# Patient Record
Sex: Male | Born: 1945 | Race: White | Marital: Married | Smoking: Former smoker
Health system: Southern US, Community
[De-identification: ages and names within clinical notes are randomized; demographics above are authoritative.]

## PROBLEM LIST (undated history)

## (undated) DIAGNOSIS — J310 Chronic rhinitis: Secondary | ICD-10-CM

## (undated) DIAGNOSIS — Z9989 Dependence on other enabling machines and devices: Secondary | ICD-10-CM

## (undated) DIAGNOSIS — M1990 Unspecified osteoarthritis, unspecified site: Secondary | ICD-10-CM

## (undated) DIAGNOSIS — E7800 Pure hypercholesterolemia, unspecified: Secondary | ICD-10-CM

## (undated) DIAGNOSIS — I10 Essential (primary) hypertension: Secondary | ICD-10-CM

## (undated) DIAGNOSIS — N411 Chronic prostatitis: Secondary | ICD-10-CM

## (undated) DIAGNOSIS — K649 Unspecified hemorrhoids: Secondary | ICD-10-CM

## (undated) DIAGNOSIS — J449 Chronic obstructive pulmonary disease, unspecified: Secondary | ICD-10-CM

## (undated) DIAGNOSIS — C9110 Chronic lymphocytic leukemia of B-cell type not having achieved remission: Secondary | ICD-10-CM

## (undated) DIAGNOSIS — G4733 Obstructive sleep apnea (adult) (pediatric): Secondary | ICD-10-CM

## (undated) HISTORY — DX: Pure hypercholesterolemia, unspecified: E78.00

## (undated) HISTORY — DX: Unspecified osteoarthritis, unspecified site: M19.90

## (undated) HISTORY — DX: Chronic obstructive pulmonary disease, unspecified: J44.9

## (undated) HISTORY — PX: SPINAL CORD DECOMPRESSION: SHX97

## (undated) HISTORY — DX: Chronic lymphocytic leukemia of B-cell type not having achieved remission: C91.10

## (undated) HISTORY — DX: Chronic rhinitis: J31.0

## (undated) HISTORY — DX: Chronic prostatitis: N41.1

## (undated) HISTORY — DX: Essential (primary) hypertension: I10

## (undated) HISTORY — DX: Obstructive sleep apnea (adult) (pediatric): G47.33

## (undated) HISTORY — PX: OTHER SURGICAL HISTORY: SHX169

## (undated) HISTORY — DX: Dependence on other enabling machines and devices: Z99.89

## (undated) HISTORY — DX: Unspecified hemorrhoids: K64.9

---

## 2010-11-12 ENCOUNTER — Encounter (HOSPITAL_COMMUNITY)
Admission: RE | Admit: 2010-11-12 | Discharge: 2010-11-12 | Disposition: A | Discharge status: Home / Self Care | Payer: Medicare Other | Source: Ambulatory Visit | Attending: Neurosurgery | Admitting: Neurosurgery

## 2010-11-12 LAB — BASIC METABOLIC PANEL
BUN: 12 mg/dL (ref 6–23)
CO2: 28 mEq/L (ref 19–32)
Calcium: 8.9 mg/dL (ref 8.4–10.5)
Chloride: 102 mEq/L (ref 96–112)
Creatinine, Ser: 0.88 mg/dL (ref 0.4–1.5)
GFR calc Af Amer: >60 mL/min (ref >60)
GFR calc non Af Amer: >60 mL/min (ref >60)
Glucose, Bld: 101 mg/dL — ABNORMAL HIGH (ref 70–99)
Potassium: 4.7 mEq/L (ref 3.5–5.1)
Sodium: 136 mEq/L (ref 135–145)

## 2010-11-12 LAB — CBC
HCT: 42.7 % (ref 39.0–52.0)
Hemoglobin: 15.3 g/dL (ref 13.0–17.0)
MCH: 33 pg (ref 26.0–34.0)
MCHC: 35.8 g/dL (ref 30.0–36.0)
MCV: 92 fL (ref 78.0–100.0)
Platelets: 165 10*3/uL (ref 150–400)
RBC: 4.64 MIL/uL (ref 4.22–5.81)
RDW: 12.2 % (ref 11.5–15.5)
WBC: 4.8 10*3/uL (ref 4.0–10.5)

## 2010-11-12 LAB — SURGICAL PCR SCREEN
MRSA, PCR: NEGATIVE
Staphylococcus aureus: NEGATIVE

## 2010-11-19 ENCOUNTER — Inpatient Hospital Stay (HOSPITAL_COMMUNITY)
Admission: RE | Admit: 2010-11-19 | Discharge: 2010-11-20 | DRG: 473 | Disposition: A | Discharge status: Home / Self Care | Payer: Medicare Other | Source: Ambulatory Visit | Attending: Neurosurgery | Admitting: Neurosurgery

## 2010-11-19 ENCOUNTER — Inpatient Hospital Stay (HOSPITAL_COMMUNITY): Payer: Medicare Other

## 2010-11-19 ENCOUNTER — Other Ambulatory Visit (HOSPITAL_COMMUNITY): Payer: Self-pay | Admitting: Neurosurgery

## 2010-11-19 ENCOUNTER — Ambulatory Visit (HOSPITAL_COMMUNITY)
Admission: RE | Admit: 2010-11-19 | Discharge: 2010-11-19 | Disposition: A | Discharge status: Home / Self Care | Payer: Medicare Other | Source: Ambulatory Visit | Attending: Neurosurgery | Admitting: Neurosurgery

## 2010-11-19 DIAGNOSIS — Z01818 Encounter for other preprocedural examination: Secondary | ICD-10-CM

## 2010-11-19 DIAGNOSIS — N400 Enlarged prostate without lower urinary tract symptoms: Secondary | ICD-10-CM | POA: Diagnosis present

## 2010-11-19 DIAGNOSIS — Z01812 Encounter for preprocedural laboratory examination: Secondary | ICD-10-CM

## 2010-11-19 DIAGNOSIS — Z23 Encounter for immunization: Secondary | ICD-10-CM

## 2010-11-19 DIAGNOSIS — M47812 Spondylosis without myelopathy or radiculopathy, cervical region: Principal | ICD-10-CM | POA: Diagnosis present

## 2010-11-19 DIAGNOSIS — G4733 Obstructive sleep apnea (adult) (pediatric): Secondary | ICD-10-CM | POA: Diagnosis present

## 2010-11-19 DIAGNOSIS — M5020 Other cervical disc displacement, unspecified cervical region: Secondary | ICD-10-CM | POA: Diagnosis present

## 2010-11-19 DIAGNOSIS — J45909 Unspecified asthma, uncomplicated: Secondary | ICD-10-CM | POA: Diagnosis present

## 2010-11-19 DIAGNOSIS — Z87891 Personal history of nicotine dependence: Secondary | ICD-10-CM

## 2010-11-19 DIAGNOSIS — E669 Obesity, unspecified: Secondary | ICD-10-CM | POA: Diagnosis present

## 2010-11-22 NOTE — Op Note (Signed)
NAME:  LAQUINTON, BIHM               ACCOUNT NO.:  [id number]  MEDICAL RECORD NO.:  [id number]           PATIENT TYPE:  O  LOCATION:  XRAY                         FACILITY:  [facility]  PHYSICIAN:  Hewitt Shorts, M.D.DATE OF BIRTH:  Dec 03, 1945  DATE OF PROCEDURE:  11/19/2010 DATE OF DISCHARGE:  11/19/2010                              OPERATIVE REPORT   PREOPERATIVE DIAGNOSES:  Cervical disk herniation, cervical spondylosis, cervical degenerative disc disease, and cervical radiculopathy.  POSTOPERATIVE DIAGNOSES:  Cervical disk herniation, cervical spondylosis, cervical degenerative disc disease, and cervical radiculopathy.  PROCEDURE:  C4-5, C5-6, and C6-7 anterior cervical decompression arthrodesis with allograft and tether cervical plating.  SURGEON:  Hewitt Shorts, MD  ASSISTANT:  Danae Orleans. Venetia Maxon, MD  ANESTHESIA:  General endotracheal.  INDICATIONS:  The patient is a 65 year old man who presented with neck pain and right cervical radicular pain.  He has had a long history of neck pain for at least 10 years.  He has undergone extensive nonsurgical management.  His symptoms were steadily worsening and less responsive to nonsurgical treatment and MRI scan showed multilevel spondylotic disk herniation with more encroachment to the right and to the left at several levels and a decision was made to proceed with decompression and arthrodesis.  PROCEDURE:  The patient was brought to the operating room and placed under general endotracheal anesthesia.  The patient was placed in 10 pounds of Holter traction.  Neck was prepped with Betadine soap solution and draped in sterile fashion.  An oblique incision was made on the left side of neck paralleling the anterior border of the left sternocleidomastoid.  The line of incision was infiltrated with local epinephrine.  Dissection was carried down through subcutaneous tissue and platysma.  Bipolar electrocautery was used to maintain  hemostasis. Dissection was then carried out through an avascular plane leaving the sternocleidomastoid, carotid artery, and jugular vein laterally and trachea and esophagus medially.  The ventral aspect of the vertebral column was identified and localized x-ray taken in the C4-5, C5-6, and C6-7 intervertebral disk space was identified.  Diskectomy was begun at each level with incision of the annulus with micro curettes, pituitary rongeurs.  Anterior osteophytic overgrowth was removed using double- action rongeurs and the osteophyte removal tool and edge of the bone were gently packed with Gelfoam with thrombin to establish hemostasis. The operative microscope was draped and brought into the field for additional navigation, illumination, and visualization and the remainder of decompression was performed using microdissection, microsurgical technique.  Diskectomy was continued posteriorly through each of the disk spaces using micro curettes and pituitary rongeurs.  The cartilaginous endplates of the corresponding vertebral were removed using micro curettes along with the high-speed drill and posterior osteophytic overgrowth which was substantial was removed at each level using the high-speed drill and a 2-mm Kerrison punch with thin footplate.  We then carefully opened and removed the posterior longitudinal ligament at each level removing the spondylotic disk herniation along with it.  We did find on the right side at C5-6 a soft disk herniation as well and this may have contributed to the right cervical radicular symptoms.  Once the spinal canal was decompressed and the thecal sac decompressed at each level, we then turned our attention to the neural foramen and decompressed the exiting nerve roots within each neural foramen removing spondylitic overgrowth, thickened ligament, and spurring to do so and once the neural foramen and exiting nerve roots were decompressed, we established  hemostasis with use of Gelfoam soaked in thrombin.  We measured the height of the intervertebral disk space and selected three 6-mm allografts.  Each was hydrated in saline solution and then positioned in the intervertebral disk space and countersunk.  We then selected a 49-mm tether cervical plate.  It was positioned over the fusion construct and secured to the vertebra with 4 x 15 mm screws using a pair of thick screws at C4, single fixed screws at C5 and C6, and a pair of variable screws at C7.  Once all 6 screws were placed, final tightening was performed.  Each of the screws was started with a pilot hole and placed and then final tightening was performed.  The wound was irrigated with bacitracin solution, hemostasis was established and confirmed and then we proceeded with closure.  An x- ray was taken which showed the grafts, plate, and screws all in good position.  The alignment was good.  Platysma was closed with interrupted and inverted 2-0 Vicryl sutures.  Subcutaneous and subcuticular were closed with inverted 3-0 Vicryl sutures.  Skin was approximated with Dermabond.  Procedure was tolerated well.  The estimated blood loss was 75 mL.  Sponge count correct.  Following surgery, the patient was taken out of cervical traction and placed in a soft cervical collar, reversed from the anesthetic, extubated, and transferred to recovery room for further care and was noted to be moving all 4 extremities to command.     Hewitt Shorts, M.D.     RWN/MEDQ  D:  11/19/2010  T:  11/20/2010  Job:  [id number]  Electronically Signed by Shirlean Kelly M.D. on 11/22/2010 08:06:55 AM

## 2011-05-05 IMAGING — CR DG CERVICAL SPINE 2 OR 3 VIEWS
1 series · 1 of 1 positions shown · non-contrast
Comparison: None.

CLINICAL DATA: C4-C7 ACDF.

CERVICAL SPINE - 2-3 VIEW

[view not recorded]
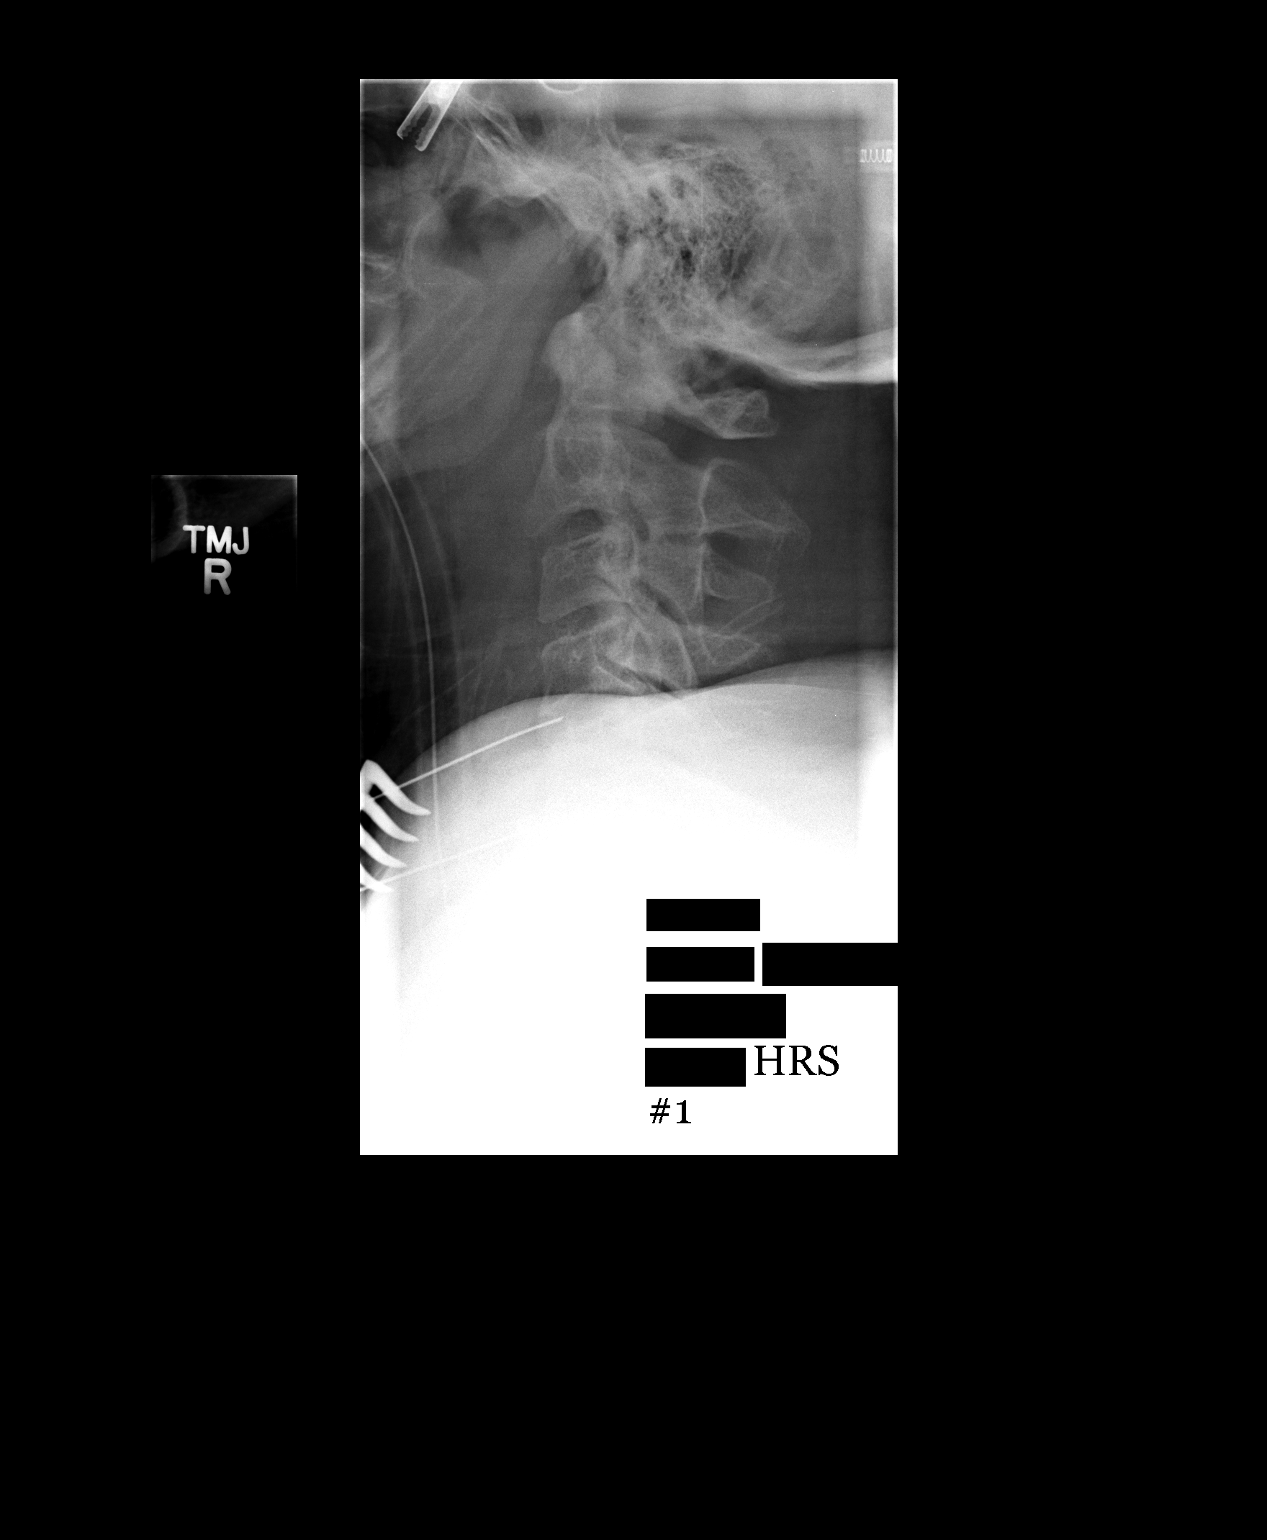

[1 of 1 positions shown; findings below may reference images not displayed]

FINDINGS: Two cross-table lateral views of the cervical spine are
submitted from the operating room.  Image #1 at 6274 hours
demonstrates anterior localizing needles at the C4-C5 and C5-C6
disc space levels.

Image #2 at 2244 hours demonstrates interval discectomy and fusion
from C4-C7.  Inferior aspect of the hardware is not well
visualized.  Hardware appears well positioned.  There are surgical
sponges within the field anterior to the plate.
IMPRESSION: Intraoperative views during C4-C7 ACDF.

## 2013-08-07 ENCOUNTER — Encounter: Payer: Self-pay | Admitting: Neurology

## 2013-08-14 ENCOUNTER — Ambulatory Visit (INDEPENDENT_AMBULATORY_CARE_PROVIDER_SITE_OTHER): Payer: Medicare Other | Admitting: Neurology

## 2013-08-14 ENCOUNTER — Encounter (INDEPENDENT_AMBULATORY_CARE_PROVIDER_SITE_OTHER): Payer: Self-pay

## 2013-08-14 ENCOUNTER — Encounter: Payer: Self-pay | Admitting: Neurology

## 2013-08-14 VITALS — BP 133/74 | HR 67 | Resp 16 | Ht 72.0 in | Wt 220.0 lb

## 2013-08-14 DIAGNOSIS — G4733 Obstructive sleep apnea (adult) (pediatric): Secondary | ICD-10-CM

## 2013-08-14 DIAGNOSIS — Z9989 Dependence on other enabling machines and devices: Secondary | ICD-10-CM

## 2013-08-14 DIAGNOSIS — J310 Chronic rhinitis: Secondary | ICD-10-CM

## 2013-08-14 HISTORY — DX: Chronic rhinitis: J31.0

## 2013-08-14 NOTE — Addendum Note (Signed)
Addended by: Melvyn NovasHMEIER, Bobbi Yount on: 08/14/2013 10:23 AM   Modules accepted: Orders

## 2013-08-14 NOTE — Progress Notes (Signed)
[facility]  Provider:  Melvyn Novasarmen  Chad Newton, M D  Referring Provider: Baird LyonsScott, Chad S, MD Primary Care Physician:  Chad LyonsSCOTT,Chad S, MD  Chief Complaint  Patient presents with  . Follow-up    yearly, Rm 11    HPI:  Chad Newton is a [age] y.o. male  Is seen here as a revisit  from Dr. Lorin PicketScott for CPAP follow up.     Last seen on 08/25/2012. The patient had undergone a sleep study after he reported an Epworth sleepiness score of 20 points. 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 Patient used CPAP since the year 2010 and was using is 90. He underwent a repeat sleep study on 06-22-12 was titrated to 8 cm water after an AHI of 20 was discovered. Is a 77 minutes at this pressure. His previous machine was set at 16 cm water and he had developed severe leaks at this but as the settings. He breathes better in a seated position and falls asleep easier for example in a recliner on a sulfa this may be related to his primary lung problem. He had no longer any EL and reported since he used CPAP the machine was reset to 8 cm water pressure with out EPR and last year his compliance she'll 4 hours and 21 minute nightly he was in the residual AHI of 7.4. He still has sometimes oral venting and today he reports that he recently overcame a head cold or flu which left him with some nasal congestion. His current Epworth sleepiness score is 8 points  His current GDS score is 1 point, his fatigue severity score is 34 points, this Epworth score is 8 points.  In office down load shows that the patient still has  fairly high air leaks,  His CPAP  pressure is still set at 8 cm water he uses his machine nightly for 4 hours and 23 minutes and his residual AHI is 6.4.  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 He used to get up at 2 AM in the morning. His bedtime  is 9 PM .    Review of Systems: Out of a complete 14 system review, the patient complains of only the following symptoms, and all other reviewed systems are negative. Rhinitis, congestion, COPD related bronchitis. His current GDS score is 1 point, his fatigue severity score is 34 points, this Epworth score is 8 points. Comfort  Blue mask.    History   Social History  . Marital Status: Married    Spouse Name: maxcine    Number of Children: 2  . Years of Education: 10   Occupational History  . retired    Social History Main Topics  . Smoking status: Former Games developermoker  . Smokeless tobacco: Not on file  . Alcohol Use: Yes  . Drug Use: No  . Sexual Activity: Not on file   Other Topics Concern  . Not on file   Social History Narrative   Patient is a caucasian male, right handed, divorced, disabled. He quit smoking 18 years ago, consumes no alcohol, formerly a heavy user, quit 18 years ago.    Family History  Problem Relation Age of Onset  . Diabetes Mother 4198  . Alzheimer's disease Father 6276  . Sleep apnea Brother     Past Medical  History  Diagnosis Date  . COPD (chronic obstructive pulmonary disease)   . Hemorrhoids   . Chronic prostatitis   . Arthritis   . Hypertension   . High cholesterol   . OSA on CPAP     tested and titrated  06-21-13   . Rhinitis, non-allergic 08/14/2013    Past Surgical History  Procedure Laterality Date  . Spinal cord decompression    . Anterior cervical compression    . Spinal stenosis surgery      Current Outpatient Prescriptions  Medication Sig Dispense Refill  . budesonide-formoterol (SYMBICORT) 160-4.5 MCG/ACT inhaler Inhale 2 puffs into the lungs 2 (two) times daily.      . Cyanocobalamin (B-12 PO) Take by mouth. 1 1/2 two times monthly.      . finasteride (PROSCAR) 5 MG tablet Take 5 mg by mouth daily.      Marland Kitchen latanoprost (XALATAN) 0.005 % ophthalmic solution 1 drop at bedtime.      Marland Kitchen lisinopril (PRINIVIL,ZESTRIL) 10 MG tablet  Take 10 mg by mouth daily.      . Multiple Vitamins-Minerals (CENTRUM SILVER PO) Take by mouth daily.      . simvastatin (ZOCOR) 20 MG tablet Take 20 mg by mouth daily.      . tamsulosin (FLOMAX) 0.4 MG CAPS capsule Take 0.4 mg by mouth daily.       No current facility-administered medications for this visit.    Allergies as of 08/14/2013  . (No Known Allergies)    Vitals: BP 133/74  Pulse 67  Resp 16  Ht 6' (1.829 m)  Wt 220 lb (99.791 kg)  BMI 29.83 kg/m2 Last Weight:  Wt Readings from Last 1 Encounters:  08/14/13 220 lb (99.791 kg)   Last Height:   Ht Readings from Last 1 Encounters:  08/14/13 6' (1.829 m)    Physical exam:  General: The patient is awake, alert and appears not in acute distress. The patient is well groomed. Head: Normocephalic, atraumatic. Neck is supple. Mallampati 3  neck circumference:17 inches . Cardiovascular:  Regular rate and rhythm, without  murmurs or carotid bruit, and without distended neck veins. Respiratory: Lungs wheezing, cough during auscultation. Skin:  Without evidence of edema, or rash Trunk: BMI is  elevated and patient  has normal posture.  Neurologic exam : The patient is awake and alert, oriented to place and time.  Memory subjective described as intact.  There is a normal attention span & concentration ability. Speech is fluent without dysarthria, dysphonia or aphasia. Mood and affect are appropriate.  Cranial nerves: Pupils are equal and briskly reactive to light. Funduscopic exam without  evidence of pallor or edema.  Extraocular movements  in vertical and horizontal planes intact and without nystagmus. Visual fields by finger perimetry are intact. Hearing to finger rub intact.  Facial sensation intact to fine touch. Facial motor strength is symmetric and tongue and uvula move midline.  Motor exam:  Normal tone and normal muscle bulk and symmetric normal strength in all extremities.  Sensory:  Fine touch, pinprick and  vibration were tested in all extremities. Proprioception is  normal.  Coordination: Finger-to-nose maneuver tested and normal without evidence of ataxia, dysmetria or tremor.  Gait and station: Patient walks without assistive device.  Deep tendon reflexes: in the  upper and lower extremities are symmetric and intact. Babinski maneuver response is  downgoing.   Assessment:  After physical and neurologic examination, review of laboratory studies, imaging, neurophysiology testing and pre-existing records, assessment is  1) OSA on CPAP with COPD.  [place] -[employer] , woud like to change to DME , [facility].  He was using a chin strap , for air leaks.  He no longer wakes up choking, suffocating.   Plan:  Treatment plan and additional workup :  Change to DME, [facility].  [place], needs new head gear , and chin strap. Compliance 72%.

## 2013-08-14 NOTE — Patient Instructions (Signed)

## 2013-08-16 ENCOUNTER — Encounter: Payer: Self-pay | Admitting: Neurology

## 2014-09-10 ENCOUNTER — Ambulatory Visit: Payer: Medicare Other | Admitting: Neurology

## 2014-10-03 ENCOUNTER — Ambulatory Visit (INDEPENDENT_AMBULATORY_CARE_PROVIDER_SITE_OTHER): Payer: Medicare Other | Admitting: Neurology

## 2014-10-03 ENCOUNTER — Encounter: Payer: Self-pay | Admitting: Neurology

## 2014-10-03 VITALS — BP 118/66 | HR 63 | Resp 18 | Ht 72.0 in | Wt 221.6 lb

## 2014-10-03 DIAGNOSIS — G458 Other transient cerebral ischemic attacks and related syndromes: Secondary | ICD-10-CM

## 2014-10-03 DIAGNOSIS — R1314 Dysphagia, pharyngoesophageal phase: Secondary | ICD-10-CM | POA: Diagnosis not present

## 2014-10-03 DIAGNOSIS — G4733 Obstructive sleep apnea (adult) (pediatric): Secondary | ICD-10-CM | POA: Diagnosis not present

## 2014-10-03 DIAGNOSIS — Z9989 Dependence on other enabling machines and devices: Secondary | ICD-10-CM

## 2014-10-03 DIAGNOSIS — R490 Dysphonia: Secondary | ICD-10-CM | POA: Diagnosis not present

## 2014-10-03 MED ORDER — ALPRAZOLAM 1 MG PO TABS: 1.0000 mg | ORAL_TABLET | Freq: Every evening | ORAL | Status: DC | PRN

## 2014-10-03 NOTE — Addendum Note (Signed)
Addended by: Melvyn NovasHMEIER, Cordarro Spinnato on: 10/03/2014 09:59 AM   Modules accepted: Orders

## 2014-10-03 NOTE — Progress Notes (Signed)
[facility]  Provider:  Melvyn Novasarmen  Alex Mcmanigal, M D  Referring Provider: Baird LyonsScott, Peter S, MD Primary Care Physician:  Baird LyonsSCOTT,PETER S, MD  Chief Complaint  Patient presents with  . RV cpap    Rm 10, alone    HPI:  Chad Newton is a 69 y.o. male  Is seen here as a revisit  from Dr. Lorin PicketScott for CPAP follow up.     Last seen on 08/25/2012. The patient had undergone a sleep study after he reported an Epworth sleepiness score of 20 points. 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 Patient used CPAP since the year 2010 and was using it nightly. He underwent a repeat sleep study on 06-22-12 was titrated to 8 cm water after an AHI of 20 was discovered. Is a 77 minutes at this pressure. His previous machine was set at 16 cm water and he had developed severe leaks at this but as the settings. He breathes better in a seated position and falls asleep easier for example in a recliner on a sulfa this may be related to his primary lung problem. He had no longer any EL and reported since he used CPAP the machine was reset to 8 cm water pressure with out EPR and last year his compliance she'll 4 hours and 21 minute nightly he was in the residual AHI of 7.4. He still has sometimes oral venting and today he reports that he recently overcame a head cold or flu which left him with some nasal congestion. His current Epworth sleepiness score is 8 points  His current GDS score is 1 point, his fatigue severity score is 34 points, this Epworth score is 8 points.  In office down load shows that the patient still has  fairly high air leaks,  His CPAP  pressure is still set at 8 cm water he uses his machine nightly for 4 hours and 23 minutes and his residual AHI is 6.4.  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 He used to get up at 2 AM in the morning. His  bedtime is 9 PM . Change to DME, [facility].  [place], needs new head gear , and chin strap. Compliance 72%.  Interval history 10-03-14.  The patient was not satisfied with advanced home care as they wouldn't supply him with necessary equipment for his CPAP.  [facility] [place] on [address] [place] [id number] customer representative this Ulla GalloBarbara McClung phone number is [phone]. [email or web address].  The patient endorsed today the fatigue severity scale at 28 points and the Epworth Sleepiness Scale at 7 points both are satisfactory. His medication lists no medication that would cause him to be sleepy or fatigued. He is on Zocor, Flomax, Prinivil, eyedrops Symbicort inhaler and Proscar.  His CPAP download dated 10-03-14, he has a 95% compliance. His usual user time is over 4 hours. Average time of use is 4 hours and 41 minutes. He had still an AHI of 6.1. He is CMS compliant. The pressure is set at 8 cm water with 0 EPR. He says he goes to sleep promptly with the machine and does not change the ramp time or final pressure.  The patient has a hoarse voice slight dysphonia and he reports a new symptom today he does have  swallowing difficulties for dry and flaky substances but also ice cold water or ice cream. He feels like something is lodged in his throat blocking. He also responds with a coughing spell. He has a history of hiatal hernia. He does not have any  myasthenic complaints. The dysarthria happens in the morning as well as in the  Evening. In the morning he has a lot of phlegm in his throat and has to clear it. I suspect that his dysarthria and dysphonia are related to acid reflux. He has not lost grip strength, he has not had any ptosis and he has not had diplopia. He is not usually short of breath. I would like him to be evaluated by a gastrointestinal specialist. He lives in [place] and his primary care physician is located in  [place].     Review of Systems:  Dysphagia , dysphonia, some dysarthria according to wife. Snoring without CPAP.  Out of a complete 14 system review, the patient complains of only the following symptoms, and all other reviewed systems are negative. Rhinitis, congestion, COPD related bronchitis.  His current GDS score is 3 points, his fatigue severity score is 28 from 34  points, this Epworth score is  7 from 8 points. Comfort Blue mask. AHI 6.1.    History   Social History  . Marital Status: Married    Spouse Name: maxcine  . Number of Children: 2  . Years of Education: 10   Occupational History  . retired    Social History Main Topics  . Smoking status: Former Games developer  . Smokeless tobacco: Not on file  . Alcohol Use: 0.0 oz/week    0 Standard drinks or equivalent per week     Comment: occ  . Drug Use: No  . Sexual Activity: Not on file   Other Topics Concern  . Not on file   Social History Narrative   Patient is a caucasian male, right handed, divorced, disabled. He quit smoking 18 years ago, consumes occ alcohol, formerly a heavy user, quit 18 years ago.    Family History  Problem Relation Age of Onset  . Diabetes Mother 56  . Alzheimer's disease Father 51  . Sleep apnea Brother     Past Medical History  Diagnosis Date  . COPD (chronic obstructive pulmonary disease)   . Hemorrhoids   . Chronic prostatitis   . Arthritis   . Hypertension   . High cholesterol   . OSA on CPAP     tested and titrated  06-21-13   . Rhinitis, non-allergic 08/14/2013    Past Surgical History  Procedure Laterality Date  . Spinal cord decompression    . Anterior cervical compression    . Spinal stenosis surgery      Current Outpatient Prescriptions  Medication Sig Dispense Refill  . budesonide-formoterol (SYMBICORT) 160-4.5 MCG/ACT inhaler Inhale 2 puffs into the lungs 2 (two) times daily.    . Cyanocobalamin (B-12 PO) Take by mouth. 1 1/2 two times monthly.    .  finasteride (PROSCAR) 5 MG tablet Take 5 mg by mouth daily.    Marland Kitchen latanoprost (XALATAN) 0.005 % ophthalmic solution Place 1 drop into both eyes at bedtime.     Marland Kitchen lisinopril (PRINIVIL,ZESTRIL) 10 MG tablet Take 10 mg by mouth daily.    . Multiple Vitamins-Minerals (CENTRUM SILVER PO) Take by mouth daily.    . simvastatin (ZOCOR) 40 MG tablet daily.  4  . tamsulosin (FLOMAX) 0.4 MG CAPS capsule Take 0.4 mg  by mouth daily.     No current facility-administered medications for this visit.    Allergies as of 10/03/2014  . (No Known Allergies)    Vitals: BP 118/66 mmHg  Pulse 63  Resp 18  Ht 6' (1.829 m)  Wt 221 lb 9.6 oz (100.517 kg)  BMI 30.05 kg/m2 Last Weight:  Wt Readings from Last 1 Encounters:  10/03/14 221 lb 9.6 oz (100.517 kg)   Last Height:   Ht Readings from Last 1 Encounters:  10/03/14 6' (1.829 m)    Physical exam:  General: The patient is awake, alert and appears not in acute distress. The patient is well groomed. Head: Normocephalic, atraumatic. Neck is supple. Mallampati 3  neck circumference:17 inches . Cardiovascular:  Regular rate and rhythm, without  murmurs or carotid bruit, and without distended neck veins. Respiratory: Lungs wheezing, cough during auscultation. Skin:  Without evidence of edema, or rash Trunk: BMI is  elevated and patient  has normal posture.  Neurologic exam : The patient is awake and alert, oriented to place and time.  Memory subjective described as intact.  There is a normal attention span & concentration ability. Speech is fluent without dysarthria, dysphonia or aphasia. Mood and affect are appropriate.  Cranial nerves: Pupils are equal and briskly reactive to light.  Visual fields by finger perimetry are intact. Hearing to finger rub intact.  Facial sensation intact to fine touch. Facial motor strength is symmetric and tongue and uvula move midline.  Motor exam:  Normal tone and normal muscle bulk and symmetric normal strength in all  extremities.  Sensory:  Deferred.  Coordination: Finger-to-nose maneuver tested and normal without evidence of ataxia, dysmetria or tremor.  Deep tendon reflexes: in the upper and lower extremities are symmetric and intact. Babinski maneuver response is downgoing.   Assessment:  After physical and neurologic examination, review of laboratory studies, imaging, neurophysiology testing and pre-existing records, assessment is 25 minutes   1) OSA on CPAP with COPD.  Overlap syndrome. Was on CPAP at 18 now on 8 cm water . Compliance.  2) Dysphagia and dysphonia in patient with GERD and Hiatal hernia. Referral to GI.  No evidence of myasthenia.   Patsy Lager ,DME in [place].  He was using a chin strap , for air leaks.  He no longer wakes up choking, suffocating.   Plan:  Treatment plan and additional workup : Pepcid OTC. ST for speech and swallowing evaluation. 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 He has aphasia spells  since 2011.

## 2014-10-04 LAB — COMPREHENSIVE METABOLIC PANEL
A/G RATIO: 3 — AB (ref 1.1–2.5)
ALT: 24 IU/L (ref 0–44)
AST: 22 IU/L (ref 0–40)
Albumin: 5.1 g/dL — ABNORMAL HIGH (ref 3.6–4.8)
Alkaline Phosphatase: 67 IU/L (ref 39–117)
BILIRUBIN TOTAL: 0.9 mg/dL (ref 0.0–1.2)
BUN / CREAT RATIO: 13 (ref 10–22)
BUN: 13 mg/dL (ref 8–27)
CO2: 25 mmol/L (ref 18–29)
CREATININE: 0.99 mg/dL (ref 0.76–1.27)
Calcium: 9.4 mg/dL (ref 8.6–10.2)
Chloride: 98 mmol/L (ref 97–108)
GFR calc non Af Amer: 78 mL/min/{1.73_m2} (ref >59)
GFR, EST AFRICAN AMERICAN: 90 mL/min/{1.73_m2} (ref >59)
GLOBULIN, TOTAL: 1.7 g/dL (ref 1.5–4.5)
GLUCOSE: 92 mg/dL (ref 65–99)
Potassium: 5 mmol/L (ref 3.5–5.2)
SODIUM: 137 mmol/L (ref 134–144)
TOTAL PROTEIN: 6.8 g/dL (ref 6.0–8.5)

## 2014-10-05 ENCOUNTER — Encounter: Payer: Self-pay | Admitting: Neurology

## 2014-10-05 NOTE — Progress Notes (Signed)
Quick Note:  I spoke to pt and relayed the results of cmp and mild elevated albumin, no clinical concern. Pt verbalized understanding. ______

## 2014-10-29 ENCOUNTER — Ambulatory Visit
Admission: RE | Admit: 2014-10-29 | Discharge: 2014-10-29 | Disposition: A | Discharge status: Home / Self Care | Payer: Medicare Other | Source: Ambulatory Visit | Attending: Neurology | Admitting: Neurology

## 2014-10-29 DIAGNOSIS — G4733 Obstructive sleep apnea (adult) (pediatric): Secondary | ICD-10-CM

## 2014-10-29 DIAGNOSIS — R1314 Dysphagia, pharyngoesophageal phase: Secondary | ICD-10-CM

## 2014-10-29 DIAGNOSIS — G458 Other transient cerebral ischemic attacks and related syndromes: Secondary | ICD-10-CM

## 2014-10-29 DIAGNOSIS — R490 Dysphonia: Secondary | ICD-10-CM

## 2014-10-29 DIAGNOSIS — Z9989 Dependence on other enabling machines and devices: Secondary | ICD-10-CM

## 2014-10-29 MED ORDER — GADOBENATE DIMEGLUMINE 529 MG/ML IV SOLN
20.0000 mL | Freq: Once | INTRAVENOUS | Status: AC | PRN
  Administered 2014-10-29: 20 mL via INTRAVENOUS

## 2014-11-23 ENCOUNTER — Telehealth: Payer: Self-pay | Admitting: Neurology

## 2014-11-23 DIAGNOSIS — G4733 Obstructive sleep apnea (adult) (pediatric): Secondary | ICD-10-CM

## 2014-11-23 DIAGNOSIS — Z9989 Dependence on other enabling machines and devices: Principal | ICD-10-CM

## 2014-11-23 NOTE — Telephone Encounter (Signed)
We'll order CPAP supplies [facility] [place] [facility] as requested per patient. He wanted to possibly try a new mask, a DREAM ware as I recall.  Please send to [facility] and have them contact us if the patient is unsure about the type of mask he needs. CD

## 2014-11-23 NOTE — Telephone Encounter (Signed)
Pt called stating he saw Dr Vickey Hugerohmeier in March and was told a Rx for CPAP mask would be submitted. Pt never received the Rx and wanted to check on the status of this request. Please contact pt when request is reviewed.

## 2014-11-26 ENCOUNTER — Telehealth: Payer: Self-pay

## 2014-11-26 NOTE — Telephone Encounter (Signed)
Called to inform pt that the cpap rx he was requesting [facility]. Pt understood. Encouraged pt to call back with any questions or concerns.

## 2014-12-11 ENCOUNTER — Telehealth: Payer: Self-pay

## 2014-12-11 NOTE — Telephone Encounter (Signed)
-----   Message from Melvyn Novasarmen Dohmeier, MD sent at 12/07/2014 11:57 AM EDT ----- Normal MRI- was this called to the patient during my absence.?

## 2014-12-11 NOTE — Telephone Encounter (Signed)
Called pt and informed him that his MRI ordered by Dr. Vickey Hugerohmeier was normal. Pt verbalized understanding.

## 2015-01-14 ENCOUNTER — Ambulatory Visit: Payer: Medicare Other | Admitting: Adult Health

## 2015-02-11 ENCOUNTER — Telehealth: Payer: Self-pay | Admitting: Neurology

## 2015-02-11 NOTE — Telephone Encounter (Signed)
Spoke with Chad Newton. She is going to fax us a copy of the speech therapy evaluation ordered by Dr. Vickey Hugerohmeier.

## 2015-02-11 NOTE — Telephone Encounter (Signed)
Chad Newton called and requested to speak with Baxter HireKristen RN regarding the fax she sent earlier. She would like to know what information is needed. Please call and advise.

## 2015-02-12 ENCOUNTER — Encounter: Payer: Self-pay | Admitting: Neurology

## 2015-02-12 ENCOUNTER — Ambulatory Visit (INDEPENDENT_AMBULATORY_CARE_PROVIDER_SITE_OTHER): Payer: Medicare Other | Admitting: Neurology

## 2015-02-12 VITALS — BP 136/76 | HR 72 | Resp 20 | Ht 72.0 in | Wt 227.0 lb

## 2015-02-12 DIAGNOSIS — G4733 Obstructive sleep apnea (adult) (pediatric): Secondary | ICD-10-CM | POA: Diagnosis not present

## 2015-02-12 DIAGNOSIS — J441 Chronic obstructive pulmonary disease with (acute) exacerbation: Secondary | ICD-10-CM | POA: Diagnosis not present

## 2015-02-12 DIAGNOSIS — K2100 Gastro-esophageal reflux disease with esophagitis, without bleeding: Secondary | ICD-10-CM

## 2015-02-12 NOTE — Addendum Note (Signed)
Addended by: Geronimo RunningINKINS, Jasiyah Poland A on: 02/12/2015 04:42 PM   Modules accepted: Orders

## 2015-02-12 NOTE — Progress Notes (Signed)
[facility]  Provider:  Melvyn Novas, M D  Referring Provider: Baird Lyons, MD Primary Care Physician:  Baird Lyons, MD  Chief Complaint  Patient presents with  . Follow-up    dysphonia, cpap, rm 11, alone    HPI:  Chad Newton is a 69 y.o. male  Is seen here as a revisit  from Dr. Lorin Picket for CPAP follow up.     Last seen on 08/25/2012. The patient had undergone a sleep study after he reported an Epworth sleepiness score of 20 points. 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 Patient used CPAP since the year 2010 and was using it nightly. He underwent a repeat sleep study on 06-22-12 was titrated to 8 cm water after an AHI of 20 was discovered. Is a 77 minutes at this pressure. His previous machine was set at 16 cm water and he had developed severe leaks at this but as the settings. He breathes better in a seated position and falls asleep easier for example in a recliner on a sulfa this may be related to his primary lung problem. He had no longer any EL and reported since he used CPAP the machine was reset to 8 cm water pressure with out EPR and last year his compliance she'll 4 hours and 21 minute nightly he was in the residual AHI of 7.4. He still has sometimes oral venting and today he reports that he recently overcame a head cold or flu which left him with some nasal congestion. His current Epworth sleepiness score is 8 points  His current GDS score is 1 point, his fatigue severity score is 34 points, this Epworth score is 8 points.  In office down load shows that the patient still has  fairly high air leaks,  His CPAP  pressure is still set at 8 cm water he uses his machine nightly for 4 hours and 23 minutes and his residual AHI is 6.4.  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 He used to get up at 2 AM in the  morning. His bedtime is 9 PM . Change to DME, [facility].  [place], needs new head gear , and chin strap. Compliance 72%.  Interval history 10-03-14.  The patient was not satisfied with advanced home care as they wouldn't supply him with necessary equipment for his CPAP.  He will change to [facility] in [facility] on [address] [place] [address] customer representative this Ulla Gallo phone number is [phone]. [email or web address]  The patient endorsed today the fatigue severity scale at 28 points and the Epworth Sleepiness Scale at 7 points both are satisfactory. His medication lists no medication that would cause him to be sleepy or fatigued. He is on Zocor, Flomax, Prinivil, eyedrops Symbicort inhaler and Proscar.  His CPAP download dated 10-03-14, he has a 95% compliance. His usual user time is over 4 hours. Average time of use is 4 hours and 41 minutes. He had still an AHI of 6.1. He is CMS compliant. The pressure is set at 8 cm water with 0 EPR. He says he goes to sleep promptly with the machine and does not change the ramp time or final pressure.  The patient has a hoarse voice slight dysphonia and he reports a new symptom today he does  have swallowing difficulties for dry and flaky substances but also ice cold water or ice cream. He feels like something is lodged in his throat blocking. He also responds with a coughing spell. He has a history of hiatal hernia. He does not have any  myasthenic complaints. The dysarthria happens in the morning as well as in the  Evening.  I suspected  that his dysarthria and dysphonia are related to acid reflux.  He has not lost grip strength, he has not had any ptosis and he has not had diplopia. He is not usually short of breath. I would like him to be evaluated by a gastrointestinal specialist.  He lives in [place] and his primary care physician is located in [place].   Interval  history from 02-12-15, he followed up with a GI  doctor felt that he may have acid reflux disease. Sleep-related questionnaires were endorsed today the geriatric depression score at one point only not indicative of depression The fear for his routine yearly follow-up on CPAP compliance . [name] compliance record is excellent 100% for over 30 days of 30 days of use and over 30 days of 4 hours of consecutive use. Average user time is 5 hours and 10 minutes set pressure is 8 cm water with 3 cm EPR residual AHI is 1.1 as to his sleep apnea that needs to be no change in settings. He just received a new CPAP machine. His days are "better, he can multitask and he is alert.  No more air leaks.   I saw him last in March 2015. In April 2016 he underwent an MRI of the brain with and without contrast after complaining of TIA presumed spells and dysphonia the MRI showed an age-appropriate normal brain with and without contrast. He had some sinusitis as is mainly in the right maxillary and in the ethmoid region. Dr. Purvis Sheffield interpreted the study. He also had a metabolic panel which was entirely normal. I have noticed as he sits here that he has some wheezing. This wheezing he states has something to do with him gaining weight. He does have COPD he does have rhinitis and he has obstructive sleep apnea on CPAP. He underwent a modified barium swallow study on 11-30-14 and a full GI evaluation. There were no signs of penetration or aspiration noted. 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   Review of Systems:  Dysphagia , dysphonia, some dysarthria according to wife. Snoring without CPAP.  Out of a complete 14 system review, the patient complains of only the following symptoms, and all other reviewed systems are negative. Rhinitis, congestion, COPD related bronchitis.  His current GDS score is 1 point, his fatigue severity score is  30 from 28 from 34  points, this Epworth score is  again 7 from 8 points.  sleeping better on CPAP   DME ; [facility]  in [place]  ; Comfort Blue mask. AHI 6.1.    History   Social History  . Marital Status: Married    Spouse Name: maxcine  . Number of Children: 2  . Years of Education: 10   Occupational History  . retired    Social History Main Topics  . Smoking status: Former Games developer  . Smokeless tobacco: Not on file  . Alcohol Use: 0.0 oz/week    0 Standard drinks or equivalent per week     Comment: occ  . Drug Use: No  . Sexual Activity: Not on file   Other Topics Concern  .  Not on file   Social History Narrative   Patient is a caucasian male, right handed, divorced, disabled. He quit smoking 18 years ago, consumes occ alcohol, formerly a heavy user, quit 18 years ago.    Family History  Problem Relation Age of Onset  . Diabetes Mother [id number]  . Alzheimer's disease Father [id number]  . Sleep apnea Brother     Past Medical History  Diagnosis Date  . COPD (chronic obstructive pulmonary disease)   . Hemorrhoids   . Chronic prostatitis   . Arthritis   . Hypertension   . High cholesterol   . OSA on CPAP     tested and titrated  06-21-13   . Rhinitis, non-allergic 08/14/2013    Past Surgical History  Procedure Laterality Date  . Spinal cord decompression    . Anterior cervical compression    . Spinal stenosis surgery      Current Outpatient Prescriptions  Medication Sig Dispense Refill  . ALPRAZolam (XANAX) 1 MG tablet Take 1 tablet (1 mg total) by mouth at bedtime as needed for anxiety. 2 tablet 0  . budesonide-formoterol (SYMBICORT) 160-4.5 MCG/ACT inhaler Inhale 2 puffs into the lungs 2 (two) times daily.    . Cyanocobalamin (B-12 PO) Take by mouth. 1 1/2 two times monthly.    . finasteride (PROSCAR) 5 MG tablet Take 5 mg by mouth daily.    Marland Kitchen. latanoprost (XALATAN) 0.005 % ophthalmic solution Place 1 drop into both eyes at bedtime.     Marland Kitchen. lisinopril (PRINIVIL,ZESTRIL) 10 MG tablet Take 10 mg by mouth daily.    . Multiple Vitamins-Minerals (CENTRUM SILVER PO) Take by mouth daily.     . simvastatin (ZOCOR) 40 MG tablet daily.  4  . tamsulosin (FLOMAX) 0.4 MG CAPS capsule Take 0.4 mg by mouth daily.     No current facility-administered medications for this visit.    Allergies as of 02/12/2015  . (No Known Allergies)    Vitals: BP 136/76 mmHg  Pulse 72  Resp 20  Ht 6' (1.829 m)  Wt 227 lb (102.967 kg)  BMI 30.78 kg/m2 Last Weight:  Wt Readings from Last 1 Encounters:  02/12/15 227 lb (102.967 kg)   Last Height:   Ht Readings from Last 1 Encounters:  02/12/15 6' (1.829 m)    Physical exam:  General: The patient is awake, alert and appears not in acute distress. The patient is well groomed. Head: Normocephalic, atraumatic. Neck is supple. Mallampati 3  neck circumference:17 inches . Cardiovascular:  Regular rate and rhythm, without murmurs or carotid bruit, and without distended neck veins. Respiratory: Lungs wheezing, cough during auscultation. Skin:  Without evidence of edema, or rash Trunk: BMI is  elevated and patient  has normal posture.  Neurologic exam : The patient is awake and alert, oriented to place and time.  Memory subjective described as intact.  There is a normal attention span & concentration ability.  Speech is fluent without dysarthria, dysphonia or aphasia. Mood and affect are appropriate.  Cranial nerves: Pupils are equal and briskly reactive to light.  Visual fields by finger perimetry are intact. Hearing loss evident, air conduction.  Facial sensation intact to fine touch. Facial motor strength is symmetric and tongue and uvula move midline.  Motor exam:  Normal tone and normal muscle bulk and symmetric normal strength in all extremities.  Sensory:  Deferred. No numbness , tingling,  Coordination: Finger-to-nose maneuver tested and normal without evidence of ataxia, dysmetria or tremor. Deep tendon reflexes: in  the upper and lower extremities are symmetric and intact.  Babinski maneuver response is downgoing.   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 Was on CPAP at 18 now on 8 cm water . Compliance.  2) Dysphagia and dysphonia in patient with GERD and Hiatal hernia. Referral to GI.  No evidence of myasthenia.   [facility] ,DME in [place].  He was using a chin strap , for air leaks.  He no longer wakes up choking, suffocating.   Plan:  Treatment plan and additional workup : continue CPAP , [facility] [place] is DME.

## 2015-02-12 NOTE — Patient Instructions (Signed)

## 2015-05-28 ENCOUNTER — Telehealth: Payer: Self-pay | Admitting: Neurology

## 2015-05-28 NOTE — Telephone Encounter (Signed)
[facility] as she is faxing over forms that need to be filled out so that this patient can get new CPAP supplies.  Thanks!

## 2015-05-28 NOTE — Telephone Encounter (Signed)
Faxed to [facility]

## 2016-03-17 ENCOUNTER — Encounter: Payer: Self-pay | Admitting: Neurology

## 2016-03-17 ENCOUNTER — Ambulatory Visit (INDEPENDENT_AMBULATORY_CARE_PROVIDER_SITE_OTHER): Payer: Medicare Other | Admitting: Neurology

## 2016-03-17 VITALS — BP 158/80 | HR 64 | Resp 20 | Ht 72.0 in | Wt 221.0 lb

## 2016-03-17 DIAGNOSIS — G4733 Obstructive sleep apnea (adult) (pediatric): Secondary | ICD-10-CM

## 2016-03-17 DIAGNOSIS — G4719 Other hypersomnia: Secondary | ICD-10-CM | POA: Diagnosis not present

## 2016-03-17 DIAGNOSIS — J4530 Mild persistent asthma, uncomplicated: Secondary | ICD-10-CM | POA: Diagnosis not present

## 2016-03-17 DIAGNOSIS — J449 Chronic obstructive pulmonary disease, unspecified: Secondary | ICD-10-CM | POA: Diagnosis not present

## 2016-03-17 DIAGNOSIS — J45909 Unspecified asthma, uncomplicated: Secondary | ICD-10-CM | POA: Insufficient documentation

## 2016-03-17 NOTE — Progress Notes (Signed)
[facility]  Provider:  Melvyn Novasarmen  Helayne Metsker, M D  Referring Provider: Baird LyonsScott, Peter S, MD Primary Care Physician:  No primary care provider on file.  Chief Complaint  Patient presents with  . Follow-up    cpap going well    HPI:  Chad Newton is a 70 y.o. male  Is seen here as a revisit  from Dr. Lorin PicketScott for CPAP follow up.     Last seen on 08/25/2012. The patient had undergone a sleep study after he reported an Epworth sleepiness score of 20 points. 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 Patient used CPAP since the year 2010 and was using it nightly. He underwent a repeat sleep study on 06-22-12 was titrated to 8 cm water after an AHI of 20 was discovered. Is a 77 minutes at this pressure. His previous machine was set at 16 cm water and he had developed severe leaks at this but as the settings. He breathes better in a seated position and falls asleep easier for example in a recliner on a sulfa this may be related to his primary lung problem. He had no longer any EL and reported since he used CPAP the machine was reset to 8 cm water pressure with out EPR and last year his compliance she'll 4 hours and 21 minute nightly he was in the residual AHI of 7.4. He still has sometimes oral venting and today he reports that he recently overcame a head cold or flu which left him with some nasal congestion. His current Epworth sleepiness score is 8 points .    CPAP download dated 10-03-14, he has a 95% compliance. His usual user time is over 4 hours. Average time of use is 4 hours and 41 minutes. He had still an AHI of 6.1. He is CMS compliant. The pressure is set at 8 cm water with 0 EPR. He says he goes to sleep promptly with the machine and does not change the ramp time or final pressure.The patient has a hoarse voice slight dysphonia and he reports a new symptom today he does have swallowing difficulties for dry and flaky substances but also  ice cold water or ice cream. He feels like something is lodged in his throat blocking. He also responds with a coughing spell. He has a history of hiatal hernia. He does not have any  myasthenic complaints. The dysarthria happens in the morning as well as in the  Evening.  I suspected  that his dysarthria and dysphonia are related to acid reflux.  He has not lost grip strength, he has not had any ptosis and he has not had diplopia. He is not usually short of breath. I would like him to be evaluated by a gastrointestinal specialist.  [place] in [place] and his primary care physician is located in [place].  Interval  history from 02-12-15, he followed up with a GI doctor felt that he may have acid reflux disease. Sleep-related questionnaires were endorsed today the geriatric depression score at one point only not indicative of depression-   routine yearly follow-up on CPAP compliance. Chad Newton compliance record is excellent 100% for over 30 days of 30 days of use and over 30 days of 4 hours of consecutive use. Average user time is 5 hours and 10 minutes set pressure is 8 cm water with 3 cm EPR residual AHI is 1.1. He just received a new CPAP machine. His days  are "better, he can multitask and he is alert. No more air leaks.   Interval history from 03/17/2016, Chad Newton has again 100% compliance with CPAP use 5 hours and 7 minutes on average the CPAP is set at 8 cm water with 3 cm EPR and his residual AHI is 1.0. He has felt further improvement in sleepiness and has been more soundly asleep since he has a new machine. He is using the air sense by [employer]. Very few air leaks. Fatigue severity score endorsed at 24 points, Epworth sleepiness scale at 5 points,  Review of Systems:  Dysphagia , dysphonia, some dysarthria - neurologic workup was without result. Out of a complete 14 system review, the patient complains of only the following symptoms, and all other reviewed systems are negative. Rhinitis,  congestion, COPD related bronchitis.  DME ; [facility] in [place]  ; Comfort Blue mask. AHI  1.0 FSS 24, EDS 5, geriatric depression :  3/15     Family History  Problem Relation Age of Onset  . Diabetes Mother 3598  . Alzheimer's disease Father [id number]  . Sleep apnea Brother     Past Medical History:  Diagnosis Date  . Arthritis   . Chronic prostatitis   . COPD (chronic obstructive pulmonary disease) (HCC)   . Hemorrhoids   . High cholesterol   . Hypertension   . OSA on CPAP    tested and titrated  06-21-13   . Rhinitis, non-allergic 08/14/2013    Past Surgical History:  Procedure Laterality Date  . anterior cervical compression    . SPINAL CORD DECOMPRESSION    . spinal stenosis surgery      Current Outpatient Prescriptions  Medication Sig Dispense Refill  . ALPRAZolam (XANAX) 1 MG tablet Take 1 tablet (1 mg total) by mouth at bedtime as needed for anxiety. 2 tablet 0  . budesonide-formoterol (SYMBICORT) 160-4.5 MCG/ACT inhaler Inhale 2 puffs into the lungs 2 (two) times daily.    . Cyanocobalamin (B-12 PO) Take by mouth. 1 1/2 two times monthly.    . cyanocobalamin 100 MCG tablet Take 100 mcg by mouth daily.    . finasteride (PROSCAR) 5 MG tablet Take 5 mg by mouth daily.    Marland Kitchen. latanoprost (XALATAN) 0.005 % ophthalmic solution Place 1 drop into both eyes at bedtime.     Marland Kitchen. lisinopril (PRINIVIL,ZESTRIL) 10 MG tablet Take 10 mg by mouth daily.    . Multiple Vitamins-Minerals (CENTRUM SILVER PO) Take by mouth daily.    . simvastatin (ZOCOR) 40 MG tablet daily.  4  . tamsulosin (FLOMAX) 0.4 MG CAPS capsule Take 0.4 mg by mouth daily.     No current facility-administered medications for this visit.     Allergies as of 03/17/2016  . (No Known Allergies)    Vitals: BP (!) 158/80   Pulse 64   Resp 20   Ht 6' (1.829 m)   Wt 221 lb (100.2 kg)   BMI 29.97 kg/m  Last Weight:  Wt Readings from Last 1 Encounters:  03/17/16 221 lb (100.2 kg)   Last Height:   Ht Readings  from Last 1 Encounters:  03/17/16 6' (1.829 m)    Physical exam:  General: The patient is awake, alert and appears not in acute distress. The patient is well groomed. Head: Normocephalic, atraumatic. Neck is supple. Mallampati 2 - neck circumference:17 inches . Improved dysphagia.  Cardiovascular:  Regular rate and rhythm, without murmurs or carotid bruit, and without distended neck veins. Respiratory: Lungs  wheezing, cough during auscultation. Skin:  Without evidence of edema, or rash Trunk: BMI is  elevated and patient  has normal posture.  Neurologic exam : The patient is awake and alert, oriented to place and time.  There is a normal attention span & concentration ability.  Speech is fluent with dysarthria, dysphonia . Mood and affect are appropriate. Cranial nerves:Pupils are equal and briskly reactive to light.  Visual fields by finger perimetry are intact. Hearing loss evident, air conduction. Facial sensation intact to fine touch. Facial motor strength is symmetric and tongue and uvula move midline.   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 Was on CPAP at 18 before changing to [facility] now on 8 cm water . Compliance is excellent, sleep improved and AHi was 1.1,.  2) Dysphagia and dysphonia in patient with GERD and Hiatal hernia. Referral to GI.  No evidence of myasthenia.   [facility] ,DME in [place].  He was using a chin strap , for air leaks.  He no longer wakes up choking, suffocating.   Plan:  Treatment plan and additional workup : continue CPAP , [facility] [place] is DME.   Chad Mcwhirter, MD   [facility].

## 2016-03-20 ENCOUNTER — Other Ambulatory Visit: Payer: Self-pay

## 2017-01-02 ENCOUNTER — Encounter: Payer: Self-pay | Admitting: Neurology

## 2017-03-17 ENCOUNTER — Ambulatory Visit (INDEPENDENT_AMBULATORY_CARE_PROVIDER_SITE_OTHER): Payer: Medicare Other | Admitting: Neurology

## 2017-03-17 ENCOUNTER — Encounter: Payer: Self-pay | Admitting: Neurology

## 2017-03-17 VITALS — BP 126/73 | HR 64 | Ht 72.0 in | Wt 229.0 lb

## 2017-03-17 DIAGNOSIS — G4733 Obstructive sleep apnea (adult) (pediatric): Secondary | ICD-10-CM

## 2017-03-17 DIAGNOSIS — J449 Chronic obstructive pulmonary disease, unspecified: Secondary | ICD-10-CM | POA: Diagnosis not present

## 2017-03-17 NOTE — Progress Notes (Signed)
[facility]  Provider:  Melvyn Novas, M D  Referring Provider: No ref. provider found Primary Care Physician:    Chief Complaint  Patient presents with  . Follow-up    pt here with wife cpap is going well. pt states he sleeps longer and that the cpap machine is working well.    HPI:  Chad Newton is a 71 y.o. male  Is seen here as a revisit  from Dr. Chales Abrahams, MD  for CPAP follow up.  I had the pleasure of seeing Chad Newton today on 03/17/2017. Chad Newton has been 100% compliant CPAP user with an average user time of 5 hours and 16 minutes each night CPAP is set to 8 cm water with 3 cm EPR and his residual apnea index is 1.0 per hour. There are no central apneas emerging. The patient reports he benefits on CPAP use, his daytime alertness and ability to participate in activities as much better. He is very happy with the CPAP use, he still is sleepier than average individual at Epworth sleepiness score of 16, fatigue severity score is endorsed at 32 points, the patient endorsed the geriatric depression score of 3 out of 15 points. He gave up soda, chocolate and coffee.    Seen on 08/25/2012. The patient had undergone a sleep study after he reported an Epworth sleepiness score of 20 points. 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 Patient used CPAP since the year 2010 and was using it nightly. He underwent a repeat sleep study on 06-22-12 was titrated to 8 cm water after an AHI of 20 was discovered. Is a 77 minutes at this pressure. His previous machine was set at 16 cm water and he had developed severe leaks at this but as the settings. He breathes better in a seated position and falls asleep easier for example in a recliner -this may be related to his primary lung problem. He had no longer any EL and reported since he used CPAP the machine was reset to 8 cm water pressure with out EPR and last year his compliance  she'll 4 hours and 21 minute nightly he was in the residual AHI of 7.4. He still has sometimes oral venting and today he reports that he recently overcame a head cold or flu which left him with some nasal congestion. His current Epworth sleepiness score is 8 points .   CPAP download dated 10-03-14, he has a 95% compliance. His usual user time is over 4 hours. Average time of use is 4 hours and 41 minutes. He had still an AHI of 6.1. He is CMS compliant. The pressure is set at 8 cm water with 0 EPR. He says he goes to sleep promptly with the machine and does not change the ramp time or final pressure.The patient has a hoarse voice slight dysphonia and he reports a new symptom today he does have swallowing difficulties for dry and flaky substances but also ice cold water or ice cream. He feels like something is lodged in his throat blocking. He also responds with a coughing spell. He has a history of hiatal hernia. He does not have any  myasthenic complaints. The dysarthria happens in the morning as well as in the  Evening.I suspected  that his dysarthria and dysphonia are related to acid reflux. He has not lost grip strength, he has not had any ptosis and he has not had  diplopia. He is not usually short of breath.I would like him to be evaluated by a gastrointestinal specialist. He lives in [place] and his primary care physician is located in [place].  I Review of Systems:  Dysphagia , dysphonia, some dysarthria - neurologic workup was without result. Out of a complete 14 system review, the patient complains of only the following symptoms, and all other reviewed systems are negative. Rhinitis, congestion, COPD related bronchitis, change in speech due to COPD. .  DME ; [facility] in [place]  , FSS 32, EDS 16 geriatric depression: 03/15     Family History  Problem Relation Age of Onset  . Diabetes Mother 71  . Alzheimer's disease Father 32  . Sleep apnea Brother     Past Medical History:   Diagnosis Date  . Arthritis   . Chronic prostatitis   . COPD (chronic obstructive pulmonary disease) (HCC)   . Hemorrhoids   . High cholesterol   . Hypertension   . OSA on CPAP    tested and titrated  06-21-13   . Rhinitis, non-allergic 08/14/2013    Past Surgical History:  Procedure Laterality Date  . anterior cervical compression    . SPINAL CORD DECOMPRESSION    . spinal stenosis surgery      Current Outpatient Prescriptions  Medication Sig Dispense Refill  . ALPRAZolam (XANAX) 1 MG tablet Take 1 tablet (1 mg total) by mouth at bedtime as needed for anxiety. 2 tablet 0  . budesonide-formoterol (SYMBICORT) 160-4.5 MCG/ACT inhaler Inhale 2 puffs into the lungs 2 (two) times daily.    . Cyanocobalamin (B-12 PO) Take 1 tablet by mouth daily. 1 1/2 two times monthly.     . cyanocobalamin 100 MCG tablet Take 100 mcg by mouth daily.    . finasteride (PROSCAR) 5 MG tablet Take 5 mg by mouth daily.    Marland Kitchen latanoprost (XALATAN) 0.005 % ophthalmic solution Place 1 drop into both eyes at bedtime.     Marland Kitchen lisinopril (PRINIVIL,ZESTRIL) 10 MG tablet Take 10 mg by mouth daily.    . Multiple Vitamins-Minerals (CENTRUM SILVER PO) Take by mouth daily.    . simvastatin (ZOCOR) 40 MG tablet daily.  4  . tamsulosin (FLOMAX) 0.4 MG CAPS capsule Take 0.4 mg by mouth daily.     No current facility-administered medications for this visit.     Allergies as of 03/17/2017  . (No Known Allergies)    Vitals: BP 126/73   Pulse 64   Ht 6' (1.829 m)   Wt 229 lb (103.9 kg)   BMI 31.06 kg/m  Last Weight:  Wt Readings from Last 1 Encounters:  03/17/17 229 lb (103.9 kg)   Last Height:   Ht Readings from Last 1 Encounters:  03/17/17 6' (1.829 m)    Physical exam:  General: The patient is awake, alert and appears not in acute distress. The patient is well groomed. Head: Normocephalic, atraumatic. Neck is supple. Mallampati 2 - neck circumference:17 inches . Improved dysphagia.  Cardiovascular:   Regular rate and rhythm, without murmurs or carotid bruit, and without distended neck veins. Respiratory: Lungs wheezing- high pitched sounds. , coughing during auscultation. Skin:  Without evidence of edema, or rash Trunk: BMI is  Elevated, normal posture.  Neurologic exam : The patient is awake and alert, oriented to place and time.  There is a normal attention span & concentration ability.  Speech is fluent with dysarthria, with  dysphonia .  Mood and affect are appropriate. Cranial nerves: Pupils  are equal and briskly reactive to light.   Visual fields by finger perimetry are intact. Hearing loss evident, air conduction. Facial sensation intact to fine touch.  Facial motor strength is symmetric and tongue and uvula move midline.   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  Well responding , now on 8 cm water . Compliance is excellent, sleep improved and AHi was 1.1,.  2) Dysphagia and dysphonia in patient with GERD and Hiatal hernia. Referral to GI.  No evidence of myasthenia.  3) Patient is doing very well on CPAP.   Patsy Lager ,DME in [place].  He was using a chin strap , for air leaks.  He no longer wakes up choking, suffocating.   Plan:  Treatment plan and additional workup : continue CPAP , [facility] is DME.   Melvyn Novas, MD   Cc Patsy Lager.

## 2018-03-21 ENCOUNTER — Ambulatory Visit: Payer: Medicare Other | Admitting: Neurology

## 2018-03-21 ENCOUNTER — Telehealth: Payer: Self-pay | Admitting: Neurology

## 2018-03-21 ENCOUNTER — Encounter: Payer: Self-pay | Admitting: Neurology

## 2018-03-21 NOTE — Telephone Encounter (Signed)
Pt no showed apt today 

## 2018-05-11 ENCOUNTER — Ambulatory Visit (INDEPENDENT_AMBULATORY_CARE_PROVIDER_SITE_OTHER): Payer: Medicare Other | Admitting: Neurology

## 2018-05-11 ENCOUNTER — Encounter: Payer: Self-pay | Admitting: Neurology

## 2018-05-11 VITALS — BP 128/63 | HR 62 | Ht 71.0 in | Wt 208.0 lb

## 2018-05-11 DIAGNOSIS — G4733 Obstructive sleep apnea (adult) (pediatric): Secondary | ICD-10-CM | POA: Diagnosis not present

## 2018-05-11 DIAGNOSIS — J449 Chronic obstructive pulmonary disease, unspecified: Secondary | ICD-10-CM | POA: Diagnosis not present

## 2018-05-11 DIAGNOSIS — Z9989 Dependence on other enabling machines and devices: Secondary | ICD-10-CM | POA: Diagnosis not present

## 2018-05-11 NOTE — Progress Notes (Signed)
[facility]  Provider:  Melvyn Novas, M D  Referring Provider: No ref. provider found Primary Care Physician:    Chief Complaint  Patient presents with  . Follow-up    pt here for yearly follow up with wife. rm 10.     HPI:  Chad Newton is a 72 y.o. male  is seen here as a revisit, originally referred by Dr. Chales Abrahams, MD  for CPAP follow up. Yearly follow up 05-11-2018 for Mr. Mccook who was seen here in the company of his wife.  He has been again very compliant CPAP user, his machine remains set at 8 cmH2O pressure with 3 cm expiratory pressure relief and his residual AHI is only 0.6/h.  He does have some air leakage but it does not affect the efficiency of apnea treatment.  He has been using the machine on average 5 hours and 1 minute at night.  He does not have Cheyne-Stokes respirations or central apneas arising. He feels that he is doing better.  He remains alert even when riding in a car, he is no longer falling asleep when mentally not stimulated or physically inactive, he can multitask. He was unable to use CPAP on 04-15-2018 due to loss of power, and woke up tired with a sore throat and achy.  His sister is newly diagnosed with OSA and now using CPAP.   I had the pleasure of seeing Mr. and Mrs. Passage today on 03/17/2017. Mr. Pullin has been 100% compliant CPAP user with an average user time of 5 hours and 16 minutes each night CPAP is set to 8 cm water with 3 cm EPR and his residual apnea index is 1.0 per hour. There are no central apneas emerging. The patient reports he benefits on CPAP use, his daytime alertness and ability to participate in activities as much better. He is very happy with the CPAP use, he still is sleepier than average individual at Epworth sleepiness score of 16, fatigue severity score is endorsed at 32 points, the patient endorsed the geriatric depression score of 3 out of 15 points. He gave up soda, chocolate and coffee.   Seen on  08/25/2012. The patient had undergone a sleep study after he reported an Epworth sleepiness score of 20 points. 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 Patient used CPAP since the year 2010 and was using it nightly. He underwent a repeat sleep study on 06-22-12 was titrated to 8 cm water after an AHI of 20 was discovered. Is a 77 minutes at this pressure. His previous machine was set at 16 cm water and he had developed severe leaks at this but as the settings. He breathes better in a seated position and falls asleep easier for example in a recliner -this may be related to his primary lung problem. He had no longer any EL and reported since he used CPAP the machine was reset to 8 cm water pressure with out EPR and last year his compliance she'll 4 hours and 21 minute nightly he was in the residual AHI of 7.4. He still has sometimes oral venting and today he reports that he recently overcame a head cold or flu which left him with some nasal congestion. His current Epworth sleepiness score is 8 points .   CPAP download dated 10-03-14, he has a 95% compliance. His usual user time is over 4 hours. Average time of use is 4 hours and  41 minutes. He had still an AHI of 6.1. He is CMS compliant. The pressure is set at 8 cm water with 0 EPR. He says he goes to sleep promptly with the machine and does not change the ramp time or final pressure.The patient has a hoarse voice slight dysphonia and he reports a new symptom today he does have swallowing difficulties for dry and flaky substances but also ice cold water or ice cream. He feels like something is lodged in his throat blocking. He also responds with a coughing spell. He has a history of hiatal hernia. He does not have any  myasthenic complaints. The dysarthria happens in the morning as well as in the  Evening.I suspected  that his dysarthria and dysphonia are related to acid reflux. He has not lost grip strength, he has  not had any ptosis and he has not had diplopia. He is not usually short of breath.I would like him to be evaluated by a gastrointestinal specialist. He lives in [place] and his primary care physician is located in [place].  I Review of Systems:  Dysphagia , dysphonia, some dysarthria - neurologic workup was without result. Out of a complete 14 system review, the patient complains of only the following symptoms, and all other reviewed systems are negative. Rhinitis, congestion, COPD related bronchitis, change in speech due to COPD. .  DME ; [facility] in [place]  , FSS 32, EDS 16 geriatric depression: 03/15     Family History  Problem Relation Age of Onset  . Diabetes Mother 72  . Alzheimer's disease Father 83  . Sleep apnea Brother     Past Medical History:  Diagnosis Date  . Arthritis   . Chronic prostatitis   . COPD (chronic obstructive pulmonary disease) (HCC)   . Hemorrhoids   . High cholesterol   . Hypertension   . OSA on CPAP    tested and titrated  06-21-13   . Rhinitis, non-allergic 08/14/2013    Past Surgical History:  Procedure Laterality Date  . anterior cervical compression    . SPINAL CORD DECOMPRESSION    . spinal stenosis surgery      Current Outpatient Medications  Medication Sig Dispense Refill  . ALPRAZolam (XANAX) 1 MG tablet Take 1 tablet (1 mg total) by mouth at bedtime as needed for anxiety. 2 tablet 0  . budesonide-formoterol (SYMBICORT) 160-4.5 MCG/ACT inhaler Inhale 2 puffs into the lungs 2 (two) times daily.    . Cyanocobalamin (B-12 PO) Take 1 tablet by mouth daily. 1 1/2 two times monthly.     . cyanocobalamin 100 MCG tablet Take 100 mcg by mouth daily.    Marland Kitchen latanoprost (XALATAN) 0.005 % ophthalmic solution Place 1 drop into both eyes at bedtime.     Marland Kitchen lisinopril (PRINIVIL,ZESTRIL) 10 MG tablet Take 10 mg by mouth daily.    . Multiple Vitamins-Minerals (CENTRUM SILVER PO) Take by mouth daily.    . simvastatin (ZOCOR) 40 MG tablet daily.  4   . tamsulosin (FLOMAX) 0.4 MG CAPS capsule Take 0.4 mg by mouth daily.     No current facility-administered medications for this visit.     Allergies as of 05/11/2018  . (No Known Allergies)    Vitals: BP 128/63   Pulse 62   Ht 5\' 11"  (1.803 m)   Wt 208 lb (94.3 kg)   BMI 29.01 kg/m  Last Weight:  Wt Readings from Last 1 Encounters:  05/11/18 208 lb (94.3 kg)   Last Height:  Ht Readings from Last 1 Encounters:  05/11/18 5\' 11"  (1.803 m)    Physical exam:  General: The patient is awake, alert and appears not in acute distress. The patient is well groomed. Head: Normocephalic, atraumatic. Neck is supple. Mallampati 2 - neck circumference:17 inches . Improved dysphagia.  Cardiovascular:  Regular rate and rhythm, without murmurs or carotid bruit, and without distended neck veins. Respiratory: Lungs wheezing- high pitched sounds. , coughing during auscultation. Skin:  Without evidence of edema, or rash Trunk: BMI is  Elevated, normal posture.  Neurologic exam : The patient is awake and alert, oriented to place and time.  There is a normal attention span & concentration ability.  Speech is fluent with dysarthria, with  dysphonia .  Mood and affect are appropriate. Cranial nerves: Pupils are equal and briskly reactive to light.   Visual fields by finger perimetry are intact. Hearing loss evident, air conduction. Facial sensation intact to fine touch.  Facial motor strength is symmetric and tongue and uvula move midline.   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 He is doing very well on 8 cm water . Compliance is excellent, sleep improved and AHi was 1.1,.  2) Dysphagia and dysphonia in patient with GERD and Hiatal hernia. Referral to GI.  No evidence of myasthenia.  3) Patient is doing very well on  CPAP.   Patsy Lager ,DME in [place]. He was using a chin strap, for air leaks. He no longer wakes up choking, suffocating.   Plan:  Treatment plan and additional workup :   Continue CPAP at these setting  , [facility] is DME.    Melvyn Novas, MD  05-11-2018    Cc Patsy Lager.

## 2018-07-05 ENCOUNTER — Encounter

## 2018-07-05 ENCOUNTER — Ambulatory Visit: Payer: Medicare Other | Admitting: Neurology

## 2019-05-16 ENCOUNTER — Ambulatory Visit (INDEPENDENT_AMBULATORY_CARE_PROVIDER_SITE_OTHER): Payer: Medicare Other | Admitting: Neurology

## 2019-05-16 ENCOUNTER — Encounter: Payer: Self-pay | Admitting: Neurology

## 2019-05-16 ENCOUNTER — Other Ambulatory Visit: Payer: Self-pay

## 2019-05-16 VITALS — BP 128/68 | HR 64 | Temp 98.2°F | Ht 71.0 in | Wt 211.0 lb

## 2019-05-16 DIAGNOSIS — R1314 Dysphagia, pharyngoesophageal phase: Secondary | ICD-10-CM | POA: Diagnosis not present

## 2019-05-16 DIAGNOSIS — R490 Dysphonia: Secondary | ICD-10-CM | POA: Diagnosis not present

## 2019-05-16 DIAGNOSIS — E663 Overweight: Secondary | ICD-10-CM

## 2019-05-16 DIAGNOSIS — Z6829 Body mass index (BMI) 29.0-29.9, adult: Secondary | ICD-10-CM

## 2019-05-16 DIAGNOSIS — G4733 Obstructive sleep apnea (adult) (pediatric): Secondary | ICD-10-CM

## 2019-05-16 DIAGNOSIS — J449 Chronic obstructive pulmonary disease, unspecified: Secondary | ICD-10-CM | POA: Diagnosis not present

## 2019-05-16 NOTE — Patient Instructions (Signed)
He will need a HST in June 2021 to get a new machine ( 77.73 years old) .  Chad Newton likes to try a [employer] ResMed F 30 I.  I have ordered this through his DME.

## 2019-05-16 NOTE — Progress Notes (Signed)
[facility]   SLEEP CLINIC   Provider:  Melvyn Novas, [place]  Referring Provider: Chales Abrahams, MD Primary Care Physician:    Chief Complaint  Patient presents with  . Follow-up    pt alone, rm 10. pt states that he recently had back surgery 15-20 weeks ago. had a rough time with sleep after the surgery. DME Patsy Lager [facility] [place])    05-16-2019 Chad Newton is a 73 y.o. male  is seen here alone - as a revisit patient , originally referred by Dr. Chales Abrahams, MD  for CPAP follow up.he had a tough year in terms of health concerns, and his wife is not well, either  He recovered surgically well , but had a lot of pain. His wife ( 67)  had to have a knee "redone' after an infection at the surgical site. She restrated already working at [employer] , as a Copywriter, advertising - 40 hours a week.  He has been all his life a short sleeper ( 4-5 hours) and usually feels rested for the day after 4 plus hours of sleep. He just restarted a walking regimen. Chad Newton has been a highly compliant CPAP user as was the case on the previous years 100% of the last 30 days he used his CPAP at 97% over 4 hours consecutively with an average use at time of 4 hours and 39 minutes, CPAP is set at a pressure of 8 cmH2O with 3 cm EPR, residual AHI is 0.7 in spite of a moderate air leak.  I am very happy with this result and there needs to be no change in the settings made.  As to his fatigue he endorsed 18 out of 63 points and Epworth Sleepiness Scale at 10 out of 24 points, the geriatric depression score was not endorsed. He is not having headaches. Hoarseness is unchanged, no progression.  He will need a HST in June 2021 to get a new machine ( 35.73 years old) .  He likes to try a FFM ResMed F 30 I.    Yearly follow up 05-11-2018 for Chad Newton who was seen here in the company of his wife.  He has been again very compliant CPAP user, his machine remains set at 8 cmH2O pressure with 3 cm expiratory pressure relief  and his residual AHI is only 0.6/h.  He does have some air leakage but it does not affect the efficiency of apnea treatment.  He has been using the machine on average 5 hours and 1 minute at night.  He does not have Cheyne-Stokes respirations or central apneas arising. He feels that he is doing better.  He remains alert even when riding in a car, he is no longer falling asleep when mentally not stimulated or physically inactive, he can multitask. He was unable to use CPAP on 04-15-2018 due to loss of power, and woke up tired with a sore throat and achy.  His sister is newly diagnosed with OSA and now using CPAP.He gave up soda, chocolate and coffee.   Seen on 08/25/2012. The patient had undergone a sleep study after he reported an Epworth sleepiness score of 20 points. 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 Patient used CPAP since the year 2010 and was using it nightly. He underwent a repeat sleep study on 06-22-12 was titrated to 8 cm water after an AHI of 20 was discovered. Is a 77 minutes at this pressure.  His previous machine was set at 16 cm water and he had developed severe leaks at this but as the settings. He breathes better in a seated position and falls asleep easier for example in a recliner -this may be related to his primary lung problem. He had no longer any EL and reported since he used CPAP the machine was reset to 8 cm water pressure with out EPR and last year his compliance she'll 4 hours and 21 minute nightly he was in the residual AHI of 7.4. He still has sometimes oral venting and today he reports that he recently overcame a head cold or flu which left him with some nasal congestion. His current Epworth sleepiness score is 8 points .   CPAP download dated 10-03-14, he has a 95% compliance. His usual user time is over 4 hours. Average time of use is 4 hours and 41 minutes. He had still an AHI of 6.1. He is CMS compliant. The pressure is set at 8  cm water with 0 EPR. He says he goes to sleep promptly with the machine and does not change the ramp time or final pressure.The patient has a hoarse voice slight dysphonia and he reports a new symptom today he does have swallowing difficulties for dry and flaky substances but also ice cold water or ice cream. He feels like something is lodged in his throat blocking. He also responds with a coughing spell. He has a history of hiatal hernia. He does not have any  myasthenic complaints. The dysarthria happens in the morning as well as in the  Evening.I suspected  that his dysarthria and dysphonia are related to acid reflux. He has not lost grip strength, he has not had any ptosis and he has not had diplopia. He is not usually short of breath.I would like him to be evaluated by a gastrointestinal specialist. He lives in [place] and his primary care physician is located in [place].  I Review of Systems:  Dysphagia , dysphonia, some dysarthria - neurologic workup was without result. Out of a complete 14 system review, the patient complains of only the following symptoms, and all other reviewed systems are negative. Rhinitis, congestion, COPD related bronchitis, change in speech due to COPD. .  DME ; [facility] in [place]  ,Chad Newton has been a highly compliant CPAP user as was the case on the previous years 100% of the last 30 days he used his CPAP at 97% over 4 hours consecutively with an average use at time of 4 hours and 39 minutes, CPAP is set at a pressure of 8 cmH2O with 3 cm EPR, residual AHI is 0.7 in spite of a moderate air leak.  I am very happy with this result and there needs to be no change in the settings made.  As to his fatigue he endorsed 18 out of 63 points and Epworth Sleepiness Scale at 10 out of 24 points, the geriatric depression score was not endorsed.    Family History  Problem Relation Age of Onset  . Diabetes Mother 57  . Alzheimer's disease Father 61  . Sleep apnea Brother      Past Medical History:  Diagnosis Date  . Arthritis   . Chronic prostatitis   . COPD (chronic obstructive pulmonary disease) ([place])   . Hemorrhoids   . High cholesterol   . Hypertension   . OSA on CPAP    tested and titrated  06-21-13   . Rhinitis, non-allergic 08/14/2013  Past Surgical History:  Procedure Laterality Date  . anterior cervical compression    . SPINAL CORD DECOMPRESSION    . spinal stenosis surgery      Current Outpatient Medications  Medication Sig Dispense Refill  . budesonide-formoterol (SYMBICORT) 160-4.5 MCG/ACT inhaler Inhale 2 puffs into the lungs 2 (two) times daily.    . Cyanocobalamin (B-12 PO) Take 1 tablet by mouth daily. 1 1/2 two times monthly.     . cyanocobalamin 100 MCG tablet Take 100 mcg by mouth daily.    Marland Kitchen. latanoprost (XALATAN) 0.005 % ophthalmic solution Place 1 drop into both eyes at bedtime.     Marland Kitchen. lisinopril (PRINIVIL,ZESTRIL) 10 MG tablet Take 10 mg by mouth daily.    . Multiple Vitamins-Minerals (CENTRUM SILVER PO) Take by mouth daily.    . simvastatin (ZOCOR) 40 MG tablet daily.  4  . tamsulosin (FLOMAX) 0.4 MG CAPS capsule Take 0.4 mg by mouth daily.     No current facility-administered medications for this visit.     Allergies as of 05/16/2019  . (No Known Allergies)    Vitals: BP 128/68   Pulse 64   Temp 98.2 F (36.8 C)   Ht 5\' 11"  (1.803 m)   Wt 211 lb (95.7 kg)   BMI 29.43 kg/m  Last Weight:  Wt Readings from Last 1 Encounters:  05/16/19 211 lb (95.7 kg)   Last Height:   Ht Readings from Last 1 Encounters:  05/16/19 5\' 11"  (1.803 m)    Physical exam:  General: The patient is awake, alert and appears not in acute distress. The patient is well groomed. Head: Normocephalic, atraumatic. Neck is supple. Mallampati 2 - neck circumference:17 inches . Improved dysphagia.  Cardiovascular:  Regular rate and rhythm, without murmurs or carotid bruit, and without distended neck veins. Respiratory: Lungs wheezing-  high pitched sounds. , coughing during auscultation. Skin:  Without evidence of edema, or rash Trunk: BMI is  Elevated, normal posture.  Neurologic exam : The patient is awake and alert, oriented to place and time.  There is a normal attention span & concentration ability.  Speech is fluent with dysarthria, with  dysphonia .  Mood and affect are appropriate. Cranial nerves:   No loss of taste or smell: Pupils are equal and briskly reactive to light.   Visual fields by finger perimetry are intact. Hearing loss evident, air conduction.  Facial sensation intact to fine touch.  Facial motor strength is symmetric, is tongue and uvula move in midline . No bite marks, no facilitations. DTR- symmetrical, babinski response deferred.   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 He is doing very well on 8 cm water . Compliance is excellent, sleep improved \. Mr. Algis GreenhouseForbes has been a highly compliant CPAP user as was the case on the previous years 100% of the last 30 days he used his CPAP at 97% over 4 hours consecutively with an average use at time of 4 hours and 39 minutes, CPAP is set at a pressure of 8 cmH2O with 3 cm EPR, residual AHI is 0.7 in spite of a moderate air leak.  I am very happy with this result and there needs to be no change in the settings made.  As to his fatigue he endorsed 18 out of 63 points and Epworth Sleepiness Scale at 10 out of 24 points, the  geriatric depression score was not endorsed.  Patsy Lager ,DME in [place]. He was using a chin strap, for air leaks. He no longer wakes up choking, suffocating.   Plan:  Treatment plan and additional workup :   Continue CPAP at these setting  , [facility] [place] is DME. He will need a HST in June 2021 to get a new machine ( 35.73 years old) .  He likes to try a FFM  ResMed F 30 I.- I ordered this for him.  Rv in May 2021 with NP to get HST ordered.      Melvyn Novas, MD  05-16-2019   Cc [facility].

## 2019-05-18 NOTE — Progress Notes (Signed)
From Tressia Miners, community message:  I have printed this order and forwarded to [facility] location.

## 2019-12-28 ENCOUNTER — Telehealth: Payer: Self-pay | Admitting: Neurology

## 2019-12-28 NOTE — Telephone Encounter (Signed)
Pt called to check on the status of his sleep study order advised he was expecting a CB to schedule

## 2019-12-28 NOTE — Telephone Encounter (Signed)
Called patient and advised him per Dr Oliva Bustard Oct note he has to be seen by NP for re eval to get home sleep study ordered. We scheduled with NP; I advised he arrive 30 min early to check in, bring new insurance cards, updated med list, wear a mask. Patient will bring CPAP chip, verbalized understanding, appreciation.

## 2019-12-31 ENCOUNTER — Encounter: Payer: Self-pay | Admitting: Adult Health

## 2020-01-01 ENCOUNTER — Ambulatory Visit (INDEPENDENT_AMBULATORY_CARE_PROVIDER_SITE_OTHER): Payer: Medicare Other | Admitting: Adult Health

## 2020-01-01 ENCOUNTER — Other Ambulatory Visit: Payer: Self-pay

## 2020-01-01 ENCOUNTER — Encounter: Payer: Self-pay | Admitting: Adult Health

## 2020-01-01 VITALS — BP 134/64 | HR 57 | Ht 71.0 in | Wt 219.0 lb

## 2020-01-01 DIAGNOSIS — G4733 Obstructive sleep apnea (adult) (pediatric): Secondary | ICD-10-CM | POA: Diagnosis not present

## 2020-01-01 DIAGNOSIS — J449 Chronic obstructive pulmonary disease, unspecified: Secondary | ICD-10-CM | POA: Diagnosis not present

## 2020-01-01 NOTE — Progress Notes (Signed)
PATIENT: Chad Newton DOB: 12/20/1945  REASON FOR VISIT: follow up HISTORY FROM: patient  HISTORY OF PRESENT ILLNESS: Today 01/01/20:  Chad Newton is a 74 year old male with a history of obstructive sleep apnea on CPAP.  His download indicates that he use his machine nightly for compliance of 100%.  He uses machine greater than 4 hours 28 days for compliance of 93%.  On average he uses his machine 4 hours and 40 minutes.  His residual AHI is 1.1 on 8 cm of water with EPR three.  He reports that the CPAP is working well for him.  He would like a new CPAP machine.  HISTORY 05-16-2019 Chad Newton is a 74 y.o. male  is seen here alone - as a revisit patient , originally referred by Dr. Chales Abrahams, MD  for CPAP follow up.he had a tough year in terms of health concerns, and his wife is not well, either  He recovered surgically well , but had a lot of pain. His wife ( 67)  had to have a knee "redone' after an infection at the surgical site. She restrated already working at [employer] , as a Copywriter, advertising - 40 hours a week.  He has been all his life a short sleeper ( 4-5 hours) and usually feels rested for the day after 4 plus hours of sleep. He just restarted a walking regimen. Chad Newton has been a highly compliant CPAP user as was the case on the previous years 100% of the last 30 days he used his CPAP at 97% over 4 hours consecutively with an average use at time of 4 hours and 39 minutes, CPAP is set at a pressure of 8 cmH2O with 3 cm EPR, residual AHI is 0.7 in spite of a moderate air leak.  I am very happy with this result and there needs to be no change in the settings made.  As to his fatigue he endorsed 18 out of 63 points and Epworth Sleepiness Scale at 10 out of 24 points, the geriatric depression score was not endorsed. He is not having headaches. Hoarseness is unchanged, no progression.  He will need a HST in June 2021 to get a new machine ( 40.74 years old) .  He likes to try a FFM ResMed F  30 I.   REVIEW OF SYSTEMS: Out of a complete 14 system review of symptoms, the patient complains only of the following symptoms, and all other reviewed systems are negative.  FSS 24 ESS 6  ALLERGIES: No Known Allergies  HOME MEDICATIONS: Outpatient Medications Prior to Visit  Medication Sig Dispense Refill  . budesonide-formoterol (SYMBICORT) 160-4.5 MCG/ACT inhaler Inhale 2 puffs into the lungs 2 (two) times daily.    . Cyanocobalamin (B-12 PO) Take 1 tablet by mouth daily. 1 1/2 two times monthly.     . cyanocobalamin 100 MCG tablet Take 100 mcg by mouth daily.    Marland Kitchen latanoprost (XALATAN) 0.005 % ophthalmic solution Place 1 drop into both eyes at bedtime.     Marland Kitchen lisinopril (PRINIVIL,ZESTRIL) 10 MG tablet Take 10 mg by mouth daily.    . Multiple Vitamins-Minerals (CENTRUM SILVER PO) Take by mouth daily.    . simvastatin (ZOCOR) 40 MG tablet daily.  4  . tamsulosin (FLOMAX) 0.4 MG CAPS capsule Take 0.4 mg by mouth daily.     No facility-administered medications prior to visit.    PAST MEDICAL HISTORY: Past Medical History:  Diagnosis Date  . Arthritis   .  Chronic prostatitis   . COPD (chronic obstructive pulmonary disease) ([facility])   . Hemorrhoids   . High cholesterol   . Hypertension   . OSA on CPAP    tested and titrated  06-21-13   . Rhinitis, non-allergic 08/14/2013    PAST SURGICAL HISTORY: Past Surgical History:  Procedure Laterality Date  . anterior cervical compression    . SPINAL CORD DECOMPRESSION    . spinal stenosis surgery      FAMILY HISTORY: Family History  Problem Relation Age of Onset  . Diabetes Mother 77  . Alzheimer's disease Father 41  . Sleep apnea Brother     SOCIAL HISTORY: Social History   Socioeconomic History  . Marital status: Married    Spouse name: maxcine  . Number of children: 2  . Years of education: 10  . Highest education level: Not on file  Occupational History  . Occupation: retired  Tobacco Use  . Smoking status:  Former Games developer  . Smokeless tobacco: Never Used  Substance and Sexual Activity  . Alcohol use: Yes    Alcohol/week: 0.0 standard drinks    Comment: occ  . Drug use: No  . Sexual activity: Not on file  Other Topics Concern  . Not on file  Social History Narrative   Patient is a caucasian male, right handed, divorced, disabled. He quit smoking 18 years ago, consumes occ alcohol, formerly a heavy user, quit 18 years ago.   Social Determinants of Health   Financial Resource Strain:   . Difficulty of Paying Living Expenses:   Food Insecurity:   . Worried About Programme researcher, broadcasting/film/video in the Last Year:   . Barista in the Last Year:   Transportation Needs:   . Freight forwarder (Medical):   Marland Kitchen Lack of Transportation (Non-Medical):   Physical Activity:   . Days of Exercise per Week:   . Minutes of Exercise per Session:   Stress:   . Feeling of Stress :   Social Connections:   . Frequency of Communication with Friends and Family:   . Frequency of Social Gatherings with Friends and Family:   . Attends Religious Services:   . Active Member of Clubs or Organizations:   . Attends Banker Meetings:   Marland Kitchen Marital Status:   Intimate Partner Violence:   . Fear of Current or Ex-Partner:   . Emotionally Abused:   Marland Kitchen Physically Abused:   . Sexually Abused:       PHYSICAL EXAM  Vitals:   01/01/20 1337  BP: 134/64  Pulse: (!) 57  Weight: 219 lb (99.3 kg)  Height: 5\' 11"  (1.803 m)   Body mass index is 30.54 kg/m.  Generalized: Well developed, in no acute distress  Chest: Lungs clear to auscultation bilaterally  Neurological examination  Mentation: Alert oriented to time, place, history taking. Follows all commands speech and language fluent Cranial nerve II-XII: Extraocular movements were full, visual field were full on confrontational test Head turning and shoulder shrug  were normal and symmetric. Motor: The motor testing reveals 5 over 5 strength of all 4  extremities. Good symmetric motor tone is noted throughout.  Sensory: Sensory testing is intact to soft touch on all 4 extremities. No evidence of extinction is noted.  Gait and station: Gait is normal.    DIAGNOSTIC DATA (LABS, IMAGING, TESTING) - I reviewed patient records, labs, notes, testing and imaging myself where available.  Lab Results  Component Value Date  WBC 4.8 11/12/2010   HGB 15.3 11/12/2010   HCT 42.7 11/12/2010   MCV 92.0 11/12/2010   PLT 165 11/12/2010      Component Value Date/Time   NA 137 10/03/2014 1018   K 5.0 10/03/2014 1018   CL 98 10/03/2014 1018   CO2 25 10/03/2014 1018   GLUCOSE 92 10/03/2014 1018   GLUCOSE 101 (H) 11/12/2010 0939   BUN 13 10/03/2014 1018   CREATININE 0.99 10/03/2014 1018   CALCIUM 9.4 10/03/2014 1018   PROT 6.8 10/03/2014 1018   ALBUMIN 5.1 (H) 10/03/2014 1018   AST 22 10/03/2014 1018   ALT 24 10/03/2014 1018   ALKPHOS 67 10/03/2014 1018   BILITOT 0.9 10/03/2014 1018   GFRNONAA 78 10/03/2014 1018   GFRAA 90 10/03/2014 1018   No results found for: CHOL, HDL, LDLCALC, LDLDIRECT, TRIG, CHOLHDL No results found for: HGBA1C No results found for: VITAMINB12 No results found for: TSH    ASSESSMENT AND PLAN 74 y.o. year old male  has a past medical history of Arthritis, Chronic prostatitis, COPD (chronic obstructive pulmonary disease) ([place]), Hemorrhoids, High cholesterol, Hypertension, OSA on CPAP, and Rhinitis, non-allergic (08/14/2013). here with:  1. OSA on CPAP  - CPAP compliance excellent - Good treatment of AHI  - Encourage patient to use CPAP nightly and > 4 hours each night - F/U in 1 year or sooner if needed   I spent 20 minutes of face-to-face and non-face-to-face time with patient.  This included previsit chart review, lab review, study review, order entry, electronic health record documentation, patient education.  Chad Givens, MSN, NP-C 01/01/2020, 1:50 PM [facility] [address]  [place],  [address] [phone]

## 2020-01-01 NOTE — Patient Instructions (Signed)
Continue using CPAP nightly and greater than 4 hours each night  Order sent for new machine If your symptoms worsen or you develop new symptoms please let us know.   

## 2020-01-01 NOTE — Progress Notes (Signed)
Order for cpap supplies sent to [facility] in [place] via community msg. Confirmation received that the order transmitted was successful.

## 2020-01-10 ENCOUNTER — Telehealth: Payer: Self-pay | Admitting: Adult Health

## 2020-01-10 NOTE — Telephone Encounter (Signed)
Patient wife called states that they called [facility] and they stated they did not receive an order from our office for new supplies for his CPAP.

## 2020-01-10 NOTE — Telephone Encounter (Signed)
Spoke to pt and wife.  I refaxed the orders to [facility] in [place].  I did receive fax confirmation.  [phone], [phone]. I relayed to pt that it did go thru and they fill follow up on it tomorrow.  Confirmed ofv number was correct.

## 2020-03-21 NOTE — Telephone Encounter (Signed)
I faxed to [facility] both # [phone] and then [phone].

## 2020-03-21 NOTE — Telephone Encounter (Signed)
Pt called while on phone with [facility] and [facility] stated that they have not received the order for a new cpap machine. Can the order please be recent to old fax number given and this fax number [phone] to see which fax number it goes through. Please advise.

## 2020-03-21 NOTE — Telephone Encounter (Signed)
Sheela from [place] called stating that they are needing the doctors notes that states the pt's pressure settings and it needs to include supplies and they are needing the actual sleep study results. Please advise.

## 2020-03-21 NOTE — Telephone Encounter (Signed)
Received fax confrmation for both fax, same # listed on confirmation [phone] sheet.  I faxed to them SS 06-21-2012, and last ofv note 01-01-20.

## 2020-09-17 ENCOUNTER — Other Ambulatory Visit: Payer: Self-pay

## 2020-09-17 ENCOUNTER — Ambulatory Visit: Payer: Medicare Other | Admitting: Adult Health

## 2020-09-17 ENCOUNTER — Ambulatory Visit (INDEPENDENT_AMBULATORY_CARE_PROVIDER_SITE_OTHER): Payer: Medicare Other | Admitting: Adult Health

## 2020-09-17 VITALS — BP 123/68 | HR 62 | Ht 71.0 in | Wt 222.0 lb

## 2020-09-17 DIAGNOSIS — J449 Chronic obstructive pulmonary disease, unspecified: Secondary | ICD-10-CM

## 2020-09-17 DIAGNOSIS — G4733 Obstructive sleep apnea (adult) (pediatric): Secondary | ICD-10-CM | POA: Diagnosis not present

## 2020-09-17 NOTE — Patient Instructions (Signed)
Continue using CPAP nightly and greater than 4 hours each night °If your symptoms worsen or you develop new symptoms please let us know.  ° °

## 2020-09-17 NOTE — Progress Notes (Signed)
PATIENT: Chad Newton DOB: 03/20/1946  REASON FOR VISIT: follow up HISTORY FROM: patient  HISTORY OF PRESENT ILLNESS: Today 09/17/20:  Chad Newton is a 75 year old male with a history of OSA on CPAP.  His download indicates that he uses machine nightly for compliance of 100%.  He uses machine greater than 4 hours each night.  On average he uses his machine 5 hours and 32 minutes.  His residual AHI is 1.3 on 8 cm of water with EPR of 3.  Leak in the 95th percentile is 26.7 L/min.  He reports that the new machine is working well for him.  He states that he can tell a difference in how he feels during the day.  He has an abnormal sleep routine.  Typically goes to bed around 7 PM and will wake up around 1 AM.  So during the day he tends to doze off if he is not busy.  Patient also reports that he is not particularly happy with his DME company.  Reports that he calls the office but no one called him back.  HISTORY 01/01/20:  Chad Newton is a 75 year old male with a history of obstructive sleep apnea on CPAP.  His download indicates that he use his machine nightly for compliance of 100%.  He uses machine greater than 4 hours 28 days for compliance of 93%.  On average he uses his machine 4 hours and 40 minutes.  His residual AHI is 1.1 on 8 cm of water with EPR three.  He reports that the CPAP is working well for him.  He would like a new CPAP machine.  REVIEW OF SYSTEMS: Out of a complete 14 system review of symptoms, the patient complains only of the following symptoms, and all other reviewed systems are negative.  FSS 27 ESS 11  ALLERGIES: No Known Allergies  HOME MEDICATIONS: Outpatient Medications Prior to Visit  Medication Sig Dispense Refill  . budesonide-formoterol (SYMBICORT) 160-4.5 MCG/ACT inhaler Inhale 2 puffs into the lungs 2 (two) times daily.    . Cyanocobalamin (B-12 PO) Take 1 tablet by mouth daily. 1 1/2 two times monthly.    . cyanocobalamin 100 MCG tablet Take 100  mcg by mouth daily.    Marland Kitchen latanoprost (XALATAN) 0.005 % ophthalmic solution Place 1 drop into both eyes at bedtime.     Marland Kitchen lisinopril (PRINIVIL,ZESTRIL) 10 MG tablet Take 10 mg by mouth daily.    . Multiple Vitamins-Minerals (CENTRUM SILVER PO) Take by mouth daily.    . simvastatin (ZOCOR) 40 MG tablet daily.  4  . tamsulosin (FLOMAX) 0.4 MG CAPS capsule Take 0.4 mg by mouth daily.     No facility-administered medications prior to visit.    PAST MEDICAL HISTORY: Past Medical History:  Diagnosis Date  . Arthritis   . Chronic prostatitis   . COPD (chronic obstructive pulmonary disease) (HCC)   . Hemorrhoids   . High cholesterol   . Hypertension   . OSA on CPAP    tested and titrated  06-21-13   . Rhinitis, non-allergic 08/14/2013    PAST SURGICAL HISTORY: Past Surgical History:  Procedure Laterality Date  . anterior cervical compression    . SPINAL CORD DECOMPRESSION    . spinal stenosis surgery      FAMILY HISTORY: Family History  Problem Relation Age of Onset  . Diabetes Mother 75  . Alzheimer's disease Father 29  . Sleep apnea Brother     SOCIAL HISTORY: Social History  Socioeconomic History  . Marital status: Married    Spouse name: maxcine  . Number of children: 2  . Years of education: 10  . Highest education level: Not on file  Occupational History  . Occupation: retired  Tobacco Use  . Smoking status: Former Games developer  . Smokeless tobacco: Never Used  Substance and Sexual Activity  . Alcohol use: Yes    Alcohol/week: 0.0 standard drinks    Comment: occ  . Drug use: No  . Sexual activity: Not on file  Other Topics Concern  . Not on file  Social History Narrative   Patient is a caucasian male, right handed, divorced, disabled. He quit smoking 18 years ago, consumes occ alcohol, formerly a heavy user, quit 18 years ago.   Social Determinants of Health   Financial Resource Strain: Not on file  Food Insecurity: Not on file  Transportation Needs: Not on  file  Physical Activity: Not on file  Stress: Not on file  Social Connections: Not on file  Intimate Partner Violence: Not on file      PHYSICAL EXAM  Vitals:   09/17/20 0855  BP: 123/68  Pulse: 62  Weight: 222 lb (100.7 kg)  Height: 5\' 11"  (1.803 m)   Body mass index is 30.96 kg/m.  Generalized: Well developed, in no acute distress  Chest: Lungs clear to auscultation bilaterally  Neurological examination  Mentation: Alert oriented to time, place, history taking. Follows all commands speech and language fluent Cranial nerve II-XII: Extraocular movements were full, visual field were full on confrontational test Head turning and shoulder shrug  were normal and symmetric. Motor: The motor testing reveals 5 over 5 strength of all 4 extremities. Good symmetric motor tone is noted throughout.  Sensory: Sensory testing is intact to soft touch on all 4 extremities. No evidence of extinction is noted.  Gait and station: Gait is normal.    DIAGNOSTIC DATA (LABS, IMAGING, TESTING) - I reviewed patient records, labs, notes, testing and imaging myself where available.  Lab Results  Component Value Date   WBC 4.8 11/12/2010   HGB 15.3 11/12/2010   HCT 42.7 11/12/2010   MCV 92.0 11/12/2010   PLT 165 11/12/2010      Component Value Date/Time   NA 137 10/03/2014 1018   K 5.0 10/03/2014 1018   CL 98 10/03/2014 1018   CO2 25 10/03/2014 1018   GLUCOSE 92 10/03/2014 1018   GLUCOSE 101 (H) 11/12/2010 0939   BUN 13 10/03/2014 1018   CREATININE 0.99 10/03/2014 1018   CALCIUM 9.4 10/03/2014 1018   PROT 6.8 10/03/2014 1018   ALBUMIN 5.1 (H) 10/03/2014 1018   AST 22 10/03/2014 1018   ALT 24 10/03/2014 1018   ALKPHOS 67 10/03/2014 1018   BILITOT 0.9 10/03/2014 1018   GFRNONAA 78 10/03/2014 1018   GFRAA 90 10/03/2014 1018      ASSESSMENT AND PLAN 75 y.o. year old male  has a past medical history of Arthritis, Chronic prostatitis, COPD (chronic obstructive pulmonary disease)  (HCC), Hemorrhoids, High cholesterol, Hypertension, OSA on CPAP, and Rhinitis, non-allergic (08/14/2013). here with:  1. OSA on CPAP  - CPAP compliance excellent - Good treatment of AHI  - Encourage patient to use CPAP nightly and > 4 hours each night - F/U in 1 year or sooner if needed   I spent 25 minutes of face-to-face and non-face-to-face time with patient.  This included previsit chart review, lab review, study review, order entry, electronic health record documentation, patient  education.  Butch Penny, MSN, NP-C 09/17/2020, 8:57 AM [facility] [address] [place], [place] [address] [phone]

## 2021-01-14 ENCOUNTER — Telehealth: Payer: Medicare Other | Admitting: Adult Health

## 2021-12-02 ENCOUNTER — Encounter: Payer: Self-pay | Admitting: Adult Health

## 2021-12-02 ENCOUNTER — Ambulatory Visit: Payer: Medicare Other | Admitting: Adult Health

## 2021-12-02 VITALS — BP 143/73 | HR 59 | Ht 71.0 in | Wt 220.6 lb

## 2021-12-02 DIAGNOSIS — Z9989 Dependence on other enabling machines and devices: Secondary | ICD-10-CM | POA: Diagnosis not present

## 2021-12-02 DIAGNOSIS — G4733 Obstructive sleep apnea (adult) (pediatric): Secondary | ICD-10-CM

## 2021-12-02 NOTE — Patient Instructions (Signed)
Continue using CPAP nightly and greater than 4 hours each night °If your symptoms worsen or you develop new symptoms please let us know.  ° °

## 2021-12-02 NOTE — Progress Notes (Signed)
? ? ?PATIENT: Chad Newton ?DOB: June 16, 1946 ? ?REASON FOR VISIT: follow up ?HISTORY FROM: patient ? ?Chief Complaint  ?Patient presents with  ? Follow-up  ?  Pt in 19 pt is here for CPAP follow up . Pt states he still sleep 4-5 hours a night   ? ? ? ?HISTORY OF PRESENT ILLNESS: ?Today 12/02/21: ? ?Chad Newton is a 76 year old male with a history of obstructive sleep apnea on CPAP.He returns today for follow-up. Reports that CPAP is working well. He does not sleep long at night. Reports that his wife was recently diagnosed with breast cancer and this has caused him some stress. He has noticed the benefit of using CPAP. ? ? ? ?09/17/20: Chad Newton is a 76 year old male with a history of OSA on CPAP.  ?His download indicates that he uses machine nightly for compliance of 100%.  He uses machine greater than 4 hours each night.  On average he uses his machine 5 hours and 32 minutes.  His residual AHI is 1.3 on 8 cm of water with EPR of 3.  Leak in the 95th percentile is 26.7 L/min.  He reports that the new machine is working well for him.  He states that he can tell a difference in how he feels during the day.  He has an abnormal sleep routine.  Typically goes to bed around 7 PM and will wake up around 1 AM.  So during the day he tends to doze off if he is not busy.  Patient also reports that he is not particularly happy with his DME company.  Reports that he calls the office but no one called him back. ? ?HISTORY 01/01/20: ?  ?Chad Newton is a 76 year old male with a history of obstructive sleep apnea on CPAP.  His download indicates that he use his machine nightly for compliance of 100%.  He uses machine greater than 4 hours 28 days for compliance of 93%.  On average he uses his machine 4 hours and 40 minutes.  His residual AHI is 1.1 on 8 cm of water with EPR three.  He reports that the CPAP is working well for him.  He would like a new CPAP machine. ? ?REVIEW OF SYSTEMS: Out of a complete 14 system review of  symptoms, the patient complains only of the following symptoms, and all other reviewed systems are negative. ? ?ESS 8 ? ?ALLERGIES: ?No Known Allergies ? ?HOME MEDICATIONS: ?Outpatient Medications Prior to Visit  ?Medication Sig Dispense Refill  ? budesonide-formoterol (SYMBICORT) 160-4.5 MCG/ACT inhaler Inhale 2 puffs into the lungs 2 (two) times daily.    ? Cyanocobalamin (B-12 PO) Take 1 tablet by mouth daily. 1 1/2 two times monthly.    ? cyanocobalamin 100 MCG tablet Take 100 mcg by mouth daily.    ? latanoprost (XALATAN) 0.005 % ophthalmic solution Place 1 drop into both eyes at bedtime.     ? lisinopril (PRINIVIL,ZESTRIL) 10 MG tablet Take 10 mg by mouth daily.    ? Multiple Vitamins-Minerals (CENTRUM SILVER PO) Take by mouth daily.    ? simvastatin (ZOCOR) 40 MG tablet daily.  4  ? tamsulosin (FLOMAX) 0.4 MG CAPS capsule Take 0.4 mg by mouth daily.    ? ?No facility-administered medications prior to visit.  ? ? ?PAST MEDICAL HISTORY: ?Past Medical History:  ?Diagnosis Date  ? Arthritis   ? Chronic prostatitis   ? COPD (chronic obstructive pulmonary disease) ([place])   ? Hemorrhoids   ?  High cholesterol   ? Hypertension   ? OSA on CPAP   ? tested and titrated  06-21-13   ? Rhinitis, non-allergic 08/14/2013  ? ? ?PAST SURGICAL HISTORY: ?Past Surgical History:  ?Procedure Laterality Date  ? anterior cervical compression    ? SPINAL CORD DECOMPRESSION    ? spinal stenosis surgery    ? ? ?FAMILY HISTORY: ?Family History  ?Problem Relation Age of Onset  ? Diabetes Mother 52  ? Alzheimer's disease Father 65  ? Sleep apnea Brother   ? ? ?SOCIAL HISTORY: ?Social History  ? ?Socioeconomic History  ? Marital status: Married  ?  Spouse name: maxcine  ? Number of children: 2  ? Years of education: 10  ? Highest education level: Not on file  ?Occupational History  ? Occupation: retired  ?Tobacco Use  ? Smoking status: Former  ? Smokeless tobacco: Never  ?Substance and Sexual Activity  ? Alcohol use: Yes  ?  Alcohol/week: 0.0  standard drinks  ?  Comment: occ  ? Drug use: No  ? Sexual activity: Not on file  ?Other Topics Concern  ? Not on file  ?Social History Narrative  ? Patient is a caucasian male, right handed, divorced, disabled. He quit smoking 18 years ago, consumes occ alcohol, formerly a heavy user, quit 18 years ago.  ? ?Social Determinants of Health  ? ?Financial Resource Strain: Not on file  ?Food Insecurity: Not on file  ?Transportation Needs: Not on file  ?Physical Activity: Not on file  ?Stress: Not on file  ?Social Connections: Not on file  ?Intimate Partner Violence: Not on file  ? ? ? ? ?PHYSICAL EXAM ? ?Vitals:  ? 12/02/21 0926  ?BP: (!) 143/73  ?Pulse: (!) 59  ?Weight: 220 lb 9.6 oz (100.1 kg)  ?Height: 5\' 11"  (1.803 m)  ? ? ?Body mass index is 30.77 kg/m?. ? ?Generalized: Well developed, in no acute distress  ?Chest: Lungs clear to auscultation bilaterally ? ?Neurological examination  ?Mentation: Alert oriented to time, place, history taking. Follows all commands speech and language fluent ?Cranial nerve II-XII: Extraocular movements were full, visual field were full on confrontational test Head turning and shoulder shrug  were normal and symmetric. ?Motor: The motor testing reveals 5 over 5 strength of all 4 extremities. Good symmetric motor tone is noted throughout.  ?Sensory: Sensory testing is intact to soft touch on all 4 extremities. No evidence of extinction is noted.  ?Gait and station: Gait is normal.  ? ? ?DIAGNOSTIC DATA (LABS, IMAGING, TESTING) ?- I reviewed patient records, labs, notes, testing and imaging myself where available. ? ?Lab Results  ?Component Value Date  ? WBC 4.8 11/12/2010  ? HGB 15.3 11/12/2010  ? HCT 42.7 11/12/2010  ? MCV 92.0 11/12/2010  ? PLT 165 11/12/2010  ? ?   ?Component Value Date/Time  ? NA 137 10/03/2014 1018  ? K 5.0 10/03/2014 1018  ? CL 98 10/03/2014 1018  ? CO2 25 10/03/2014 1018  ? GLUCOSE 92 10/03/2014 1018  ? GLUCOSE 101 (H) 11/12/2010 0939  ? BUN 13 10/03/2014 1018  ?  CREATININE 0.99 10/03/2014 1018  ? CALCIUM 9.4 10/03/2014 1018  ? PROT 6.8 10/03/2014 1018  ? ALBUMIN 5.1 (H) 10/03/2014 1018  ? AST 22 10/03/2014 1018  ? ALT 24 10/03/2014 1018  ? ALKPHOS 67 10/03/2014 1018  ? BILITOT 0.9 10/03/2014 1018  ? GFRNONAA 78 10/03/2014 1018  ? GFRAA 90 10/03/2014 1018  ? ? ? ? ?ASSESSMENT AND PLAN ?  75 y.o. year old male  has a past medical history of Arthritis, Chronic prostatitis, COPD (chronic obstructive pulmonary disease) ([place]), Hemorrhoids, High cholesterol, Hypertension, OSA on CPAP, and Rhinitis, non-allergic (08/14/2013). here with: ? ?OSA on CPAP ? ?- CPAP compliance excellent ?- Good treatment of AHI  ?- Encourage patient to use CPAP nightly and > 4 hours each night ?- F/U in 1 year or sooner if needed ? ? ? ? ?Ward Givens, MSN, NP-C 12/02/2021, 9:23 AM ?[facility] ?[address], [address] ?[place], [place] [address] ?[phone] ? ? ?

## 2022-12-02 NOTE — Progress Notes (Signed)
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 male with a history of OSA on CPAP. Returns today for follow-up.  Reports that CPAP is working well for him.  He continues to notice the benefit.  Diagnosed with CLL and reports that he has had two respiratory issues that made it hard for him to use for longer than >4 hours. His download is below     12/02/21: Chad Newton is a 77 year old male with a history of obstructive sleep apnea on CPAP.He returns today for follow-up. Reports that CPAP is working well. He does not sleep long at night. Reports that his wife was recently diagnosed with breast cancer and this has caused him some stress. He has noticed the benefit of using CPAP.    09/17/20: Chad Newton is a 77 year old male with a history of OSA on CPAP.  His download indicates that he uses machine nightly for compliance of 100%.  He uses machine greater than 4 hours each night.  On average he uses his machine 5 hours and 32 minutes.  His residual AHI is 1.3 on 8 cm of water with EPR of 3.  Leak in the 95th percentile is 26.7 L/min.  He reports that the new machine is working well for him.  He states that he can tell a difference in how he feels during the day.  He has an abnormal sleep routine.  Typically goes to bed around 7 PM and will wake up around 1 AM.  So during the day he tends to doze off if he is not busy.  Patient also reports that he is not particularly happy with his DME company.  Reports that he calls the office but no one called him back.  HISTORY 01/01/20:   Chad Newton is a 77 year old male with a history of obstructive sleep apnea on CPAP.  His download indicates that he  use his machine nightly for compliance of 100%.  He uses machine greater than 4 hours 28 days for compliance of 93%.  On average he uses his machine 4 hours and 40 minutes.  His residual AHI is 1.1 on 8 cm of water with EPR three.  He reports that the CPAP is working well for him.  He would like a new CPAP machine.  REVIEW OF SYSTEMS: Out of a complete 14 system review of symptoms, the patient complains only of the following symptoms, and all other reviewed systems are negative.  ESS 11  ALLERGIES: No Known Allergies  HOME MEDICATIONS: Outpatient Medications Prior to Visit  Medication Sig Dispense Refill   brimonidine (ALPHAGAN) 0.2 % ophthalmic solution SMARTSIG:In Eye(s)     budesonide-formoterol (SYMBICORT) 160-4.5 MCG/ACT inhaler Inhale 2 puffs into the lungs 2 (two) times daily.     Cyanocobalamin (B-12 PO) Take 1 tablet by mouth daily. 1 1/2 two times monthly.     cyanocobalamin 100 MCG tablet Take 100 mcg by mouth daily.     latanoprost (XALATAN) 0.005 % ophthalmic solution Place 1 drop into both eyes at bedtime.      lisinopril (PRINIVIL,ZESTRIL) 10 MG tablet Take 10  mg by mouth daily.     Multiple Vitamins-Minerals (CENTRUM SILVER PO) Take by mouth daily.     rosuvastatin (CRESTOR) 5 MG tablet Take 5 mg by mouth daily.     simvastatin (ZOCOR) 40 MG tablet daily.  4   tamsulosin (FLOMAX) 0.4 MG CAPS capsule Take 0.4 mg by mouth daily.     No facility-administered medications prior to visit.    PAST MEDICAL HISTORY: Past Medical History:  Diagnosis Date   Arthritis    Chronic prostatitis    COPD (chronic obstructive pulmonary disease) (HCC)    Hemorrhoids    High cholesterol    Hypertension    OSA on CPAP    tested and titrated  06-21-13    Rhinitis, non-allergic 08/14/2013    PAST SURGICAL HISTORY: Past Surgical History:  Procedure Laterality Date   anterior cervical compression     SPINAL CORD DECOMPRESSION     spinal stenosis surgery      FAMILY  HISTORY: Family History  Problem Relation Age of Onset   Diabetes Mother 36   Alzheimer's disease Father 31   Sleep apnea Brother     SOCIAL HISTORY: Social History   Socioeconomic History   Marital status: Married    Spouse name: maxcine   Number of children: 2   Years of education: 10   Highest education level: Not on file  Occupational History   Occupation: retired  Tobacco Use   Smoking status: Former   Smokeless tobacco: Never  Substance and Sexual Activity   Alcohol use: Yes    Alcohol/week: 0.0 standard drinks of alcohol    Comment: occ   Drug use: No   Sexual activity: Not on file  Other Topics Concern   Not on file  Social History Narrative   Patient is a caucasian male, right handed, divorced, disabled. He quit smoking 18 years ago, consumes occ alcohol, formerly a heavy user, quit 18 years ago.   Social Determinants of Health   Financial Resource Strain: Not on file  Food Insecurity: Not on file  Transportation Needs: Not on file  Physical Activity: Not on file  Stress: Not on file  Social Connections: Not on file  Intimate Partner Violence: Not on file      PHYSICAL EXAM  Vitals:   12/03/22 1427  BP: (!) 140/60  Pulse: 83  Weight: 224 lb 3.2 oz (101.7 kg)  Height: 6' (1.829 m)     Body mass index is 30.41 kg/m.  Generalized: Well developed, in no acute distress  Chest: Lungs clear to auscultation bilaterally  Neurological examination  Mentation: Alert oriented to time, place, history taking. Follows all commands speech and language fluent Cranial nerve II-XII: Extraocular movements were full, visual field were full on confrontational test Head turning and shoulder shrug  were normal and symmetric. Motor: The motor testing reveals 5 over 5 strength of all 4 extremities. Good symmetric motor tone is noted throughout.  Sensory: Sensory testing is intact to soft touch on all 4 extremities. No evidence of extinction is noted.  Gait and  station: Gait is normal.    DIAGNOSTIC DATA (LABS, IMAGING, TESTING) - I reviewed patient records, labs, notes, testing and imaging myself where available.  Lab Results  Component Value Date   WBC 4.8 11/12/2010   HGB 15.3 11/12/2010   HCT 42.7 11/12/2010   MCV 92.0 11/12/2010   PLT 165 11/12/2010      Component Value Date/Time   NA 137 10/03/2014 1018  K 5.0 10/03/2014 1018   CL 98 10/03/2014 1018   CO2 25 10/03/2014 1018   GLUCOSE 92 10/03/2014 1018   GLUCOSE 101 (H) 11/12/2010 0939   BUN 13 10/03/2014 1018   CREATININE 0.99 10/03/2014 1018   CALCIUM 9.4 10/03/2014 1018   PROT 6.8 10/03/2014 1018   ALBUMIN 5.1 (H) 10/03/2014 1018   AST 22 10/03/2014 1018   ALT 24 10/03/2014 1018   ALKPHOS 67 10/03/2014 1018   BILITOT 0.9 10/03/2014 1018   GFRNONAA 78 10/03/2014 1018   GFRAA 90 10/03/2014 1018      ASSESSMENT AND PLAN 77 y.o. year old male  has a past medical history of Arthritis, Chronic prostatitis, COPD (chronic obstructive pulmonary disease) (HCC), Hemorrhoids, High cholesterol, Hypertension, OSA on CPAP, and Rhinitis, non-allergic (08/14/2013). here with:  OSA on CPAP  - CPAP compliance excellent with the exception he does not use it 4 hours most nights. - Good treatment of AHI  - Encourage patient to use CPAP nightly and > 4 hours each night - Not due for a new machine until 03/2025 - F/U in 1 year or sooner if needed     Butch Penny, MSN, NP-C 12/03/2022, 11:42 AM [facility] [address] [place], [place] [address] [phone]

## 2022-12-03 ENCOUNTER — Ambulatory Visit: Payer: Medicare Other | Admitting: Adult Health

## 2022-12-03 ENCOUNTER — Encounter: Payer: Self-pay | Admitting: Adult Health

## 2022-12-03 VITALS — BP 140/60 | HR 83 | Ht 72.0 in | Wt 224.2 lb

## 2022-12-03 DIAGNOSIS — G4733 Obstructive sleep apnea (adult) (pediatric): Secondary | ICD-10-CM | POA: Diagnosis not present

## 2022-12-03 NOTE — Patient Instructions (Signed)
Continue using CPAP nightly and greater than 4 hours each night °If your symptoms worsen or you develop new symptoms please let us know.  ° °

## 2023-04-19 ENCOUNTER — Telehealth: Payer: Self-pay | Admitting: Adult Health

## 2023-04-19 DIAGNOSIS — G4733 Obstructive sleep apnea (adult) (pediatric): Secondary | ICD-10-CM

## 2023-04-19 NOTE — Telephone Encounter (Signed)
Pt has called to report that his new DME is [facility] [phone] they are asking to be contacted so that pt can go about CPAP and supplies.

## 2023-04-20 NOTE — Telephone Encounter (Signed)
Orders were faxed to pt DME this afternoon

## 2023-04-20 NOTE — Telephone Encounter (Signed)
Spoke with pt and [facility] . Confirming the change with DME  Will fax over clinical notes and orders to DME this am

## 2023-07-28 HISTORY — PX: CATARACT EXTRACTION: SUR2

## 2023-12-08 NOTE — Progress Notes (Unsigned)
 Aaron Aas

## 2023-12-09 ENCOUNTER — Encounter: Payer: Self-pay | Admitting: Adult Health

## 2023-12-09 ENCOUNTER — Ambulatory Visit: Payer: Medicare Other | Admitting: Adult Health

## 2023-12-09 VITALS — BP 135/68 | HR 67 | Ht 71.0 in | Wt 227.8 lb

## 2023-12-09 DIAGNOSIS — G4733 Obstructive sleep apnea (adult) (pediatric): Secondary | ICD-10-CM | POA: Diagnosis not present

## 2023-12-09 NOTE — Progress Notes (Signed)
 PATIENT: Chad Newton DOB: 10/16/1945  REASON FOR VISIT: follow up HISTORY FROM: patient  Chief Complaint  Patient presents with   Room 9    Pt is here with his Wife. Pt states that everything is going great with his CPAP Machine. Pt states that he isn't happy with his DME company.      HISTORY OF PRESENT ILLNESS:   Today 12/09/23:   Chad Newton is a 78 y.o. male with a history of OSA on CPAP . Returns today for follow-up.  Overall he feels that he is doing well.  He has the DreamWear mask.  He is a mouth breather but states that this mass has worked the best for him.  Also reports that since last seen he had a stress test that was unremarkable per the patient.  His download is below      12/03/22: Chad Newton is a 78 y.o. male with a history of OSA on CPAP. Returns today for follow-up.  Reports that CPAP is working well for him.  He continues to notice the benefit.  Diagnosed with CLL and reports that he has had two respiratory issues that made it hard for him to use for longer than >4 hours. His download is below     12/02/21: Chad Newton is a 78 year old male with a history of obstructive sleep apnea on CPAP.He returns today for follow-up. Reports that CPAP is working well. He does not sleep long at night. Reports that his wife was recently diagnosed with breast cancer and this has caused him some stress. He has noticed the benefit of using CPAP.    09/17/20: Chad Newton is a 78 year old male with a history of OSA on CPAP.  His download indicates that he uses machine nightly for compliance of 100%.  He uses machine greater than 4 hours each night.  On average he uses his machine 5 hours and 32 minutes.  His residual AHI is 1.3 on 8 cm of water with EPR of 3.  Leak in the 95th percentile is 26.7 L/min.  He reports that the new machine is working well for him.  He states that he can tell a difference in how he feels during the day.  He has an abnormal sleep routine.   Typically goes to bed around 7 PM and will wake up around 1 AM.  So during the day he tends to doze off if he is not busy.  Patient also reports that he is not particularly happy with his DME company.  Reports that he calls the office but no one called him back.  HISTORY 01/01/20:   Chad Newton is a 78 year old male with a history of obstructive sleep apnea on CPAP.  His download indicates that he use his machine nightly for compliance of 100%.  He uses machine greater than 4 hours 28 days for compliance of 93%.  On average he uses his machine 4 hours and 40 minutes.  His residual AHI is 1.1 on 8 cm of water with EPR three.  He reports that the CPAP is working well for him.  He would like a new CPAP machine.  REVIEW OF SYSTEMS: Out of a complete 14 system review of symptoms, the patient complains only of the following symptoms, and all other reviewed systems are negative.  ESS 11  ALLERGIES: No Known Allergies  HOME MEDICATIONS: Outpatient Medications Prior to Visit  Medication Sig Dispense Refill   azithromycin (ZITHROMAX) 500 MG  tablet Take 500 mg by mouth 3 (three) times a week.     brimonidine (ALPHAGAN) 0.2 % ophthalmic solution SMARTSIG:In Eye(s)     budesonide-formoterol (SYMBICORT) 160-4.5 MCG/ACT inhaler Inhale 2 puffs into the lungs 2 (two) times daily.     Cyanocobalamin (B-12 PO) Take 1 tablet by mouth daily. 1 1/2 two times monthly.     ezetimibe (ZETIA) 10 MG tablet Take 10 mg by mouth daily.     latanoprost (XALATAN) 0.005 % ophthalmic solution Place 1 drop into both eyes at bedtime.      lisinopril (PRINIVIL,ZESTRIL) 10 MG tablet Take 10 mg by mouth daily.     Multiple Vitamins-Minerals (CENTRUM SILVER PO) Take by mouth daily.     rosuvastatin (CRESTOR) 5 MG tablet Take 5 mg by mouth daily.     cyanocobalamin 100 MCG tablet Take 100 mcg by mouth daily.     simvastatin (ZOCOR) 40 MG tablet daily.  4   tamsulosin (FLOMAX) 0.4 MG CAPS capsule Take 0.4 mg by mouth daily.      No facility-administered medications prior to visit.    PAST MEDICAL HISTORY: Past Medical History:  Diagnosis Date   Arthritis    Chronic prostatitis    CLL (chronic lymphocytic leukemia) (HCC)    2023   COPD (chronic obstructive pulmonary disease) (HCC)    Hemorrhoids    High cholesterol    Hypertension    OSA on CPAP    tested and titrated  06-21-13    Rhinitis, non-allergic 08/14/2013    PAST SURGICAL HISTORY: Past Surgical History:  Procedure Laterality Date   anterior cervical compression     CATARACT EXTRACTION  07/2023   SPINAL CORD DECOMPRESSION     spinal stenosis surgery      FAMILY HISTORY: Family History  Problem Relation Age of Onset   Diabetes Mother 46   Alzheimer's disease Father 57   Sleep apnea Sister    Sleep apnea Brother     SOCIAL HISTORY: Social History   Socioeconomic History   Marital status: Married    Spouse name: maxcine   Number of children: 2   Years of education: 10   Highest education level: Not on file  Occupational History   Occupation: retired  Tobacco Use   Smoking status: Former   Smokeless tobacco: Never  Substance and Sexual Activity   Alcohol use: Not Currently    Comment: occ   Drug use: No   Sexual activity: Not on file  Other Topics Concern   Not on file  Social History Narrative   Patient is a caucasian male, right handed, divorced, disabled. He quit smoking 18 years ago, consumes occ alcohol, formerly a heavy user, quit 18 years ago.   Social Drivers of Corporate investment banker Strain: Not on file  Food Insecurity: Not on file  Transportation Needs: Not on file  Physical Activity: Not on file  Stress: Not on file  Social Connections: Not on file  Intimate Partner Violence: Not on file      PHYSICAL EXAM  Vitals:   12/09/23 1330  BP: 135/68  Pulse: 67  Weight: 227 lb 12.8 oz (103.3 kg)  Height: 5\' 11"  (1.803 m)     Body mass index is 31.77 kg/m.  Generalized: Well developed, in  no acute distress  Chest: Lungs clear to auscultation bilaterally  Neurological examination  Mentation: Alert oriented to time, place, history taking. Follows all commands speech and language fluent Cranial nerve II-XII: Extraocular  movements were full, visual field were full on confrontational test Head turning and shoulder shrug  were normal and symmetric. Motor: The motor testing reveals 5 over 5 strength of all 4 extremities. Good symmetric motor tone is noted throughout.  Sensory: Sensory testing is intact to soft touch on all 4 extremities. No evidence of extinction is noted.  Gait and station: Gait is normal.    DIAGNOSTIC DATA (LABS, IMAGING, TESTING) - I reviewed patient records, labs, notes, testing and imaging myself where available.  Lab Results  Component Value Date   WBC 4.8 11/12/2010   HGB 15.3 11/12/2010   HCT 42.7 11/12/2010   MCV 92.0 11/12/2010   PLT 165 11/12/2010      Component Value Date/Time   NA 137 10/03/2014 1018   K 5.0 10/03/2014 1018   CL 98 10/03/2014 1018   CO2 25 10/03/2014 1018   GLUCOSE 92 10/03/2014 1018   GLUCOSE 101 (H) 11/12/2010 0939   BUN 13 10/03/2014 1018   CREATININE 0.99 10/03/2014 1018   CALCIUM 9.4 10/03/2014 1018   PROT 6.8 10/03/2014 1018   ALBUMIN 5.1 (H) 10/03/2014 1018   AST 22 10/03/2014 1018   ALT 24 10/03/2014 1018   ALKPHOS 67 10/03/2014 1018   BILITOT 0.9 10/03/2014 1018   GFRNONAA 78 10/03/2014 1018   GFRAA 90 10/03/2014 1018      ASSESSMENT AND PLAN 78 y.o. year old male  has a past medical history of Arthritis, Chronic prostatitis, CLL (chronic lymphocytic leukemia) (HCC), COPD (chronic obstructive pulmonary disease) (HCC), Hemorrhoids, High cholesterol, Hypertension, OSA on CPAP, and Rhinitis, non-allergic (08/14/2013). here with:  OSA on CPAP  - CPAP compliance excellent - Residual AHI elevated will increase pressure to 9 cm of water - Encourage patient to use CPAP nightly and > 4 hours each night - F/U  in 1 year or sooner if needed     Clem Currier, MSN, NP-C 12/09/2023, 1:46 PM [facility] [address], [address] [place], [place] [address] [phone]

## 2023-12-09 NOTE — Patient Instructions (Signed)
 Continue using CPAP nightly and greater than 4 hours each night We will Change pressure 9 cm H20  If your symptoms worsen or you develop new symptoms please let us  know.

## 2024-12-12 ENCOUNTER — Telehealth: Admitting: Adult Health
